# Patient Record
Sex: Female | Born: 1945 | Race: White | Hispanic: No | Marital: Married | Smoking: Former smoker
Health system: Southern US, Community
[De-identification: ages and names within clinical notes are randomized; demographics above are authoritative.]

## PROBLEM LIST (undated history)

## (undated) DIAGNOSIS — K648 Other hemorrhoids: Secondary | ICD-10-CM

## (undated) DIAGNOSIS — K219 Gastro-esophageal reflux disease without esophagitis: Secondary | ICD-10-CM

## (undated) DIAGNOSIS — C3490 Malignant neoplasm of unspecified part of unspecified bronchus or lung: Secondary | ICD-10-CM

## (undated) DIAGNOSIS — K635 Polyp of colon: Secondary | ICD-10-CM

## (undated) DIAGNOSIS — K5792 Diverticulitis of intestine, part unspecified, without perforation or abscess without bleeding: Secondary | ICD-10-CM

## (undated) HISTORY — DX: Polyp of colon: K63.5

## (undated) HISTORY — DX: Diverticulitis of intestine, part unspecified, without perforation or abscess without bleeding: K57.92

## (undated) HISTORY — PX: LOBECTOMY: SHX5089

## (undated) HISTORY — DX: Other hemorrhoids: K64.8

## (undated) HISTORY — PX: KNEE ARTHROSCOPY: SUR90

## (undated) HISTORY — DX: Gastro-esophageal reflux disease without esophagitis: K21.9

## (undated) HISTORY — PX: TONSILLECTOMY: SHX5217

## (undated) HISTORY — DX: Malignant neoplasm of unspecified part of unspecified bronchus or lung: C34.90

## (undated) HISTORY — PX: TUBAL LIGATION: SHX77

---

## 2002-02-19 LAB — HM COLONOSCOPY: HM Colonoscopy: NORMAL

## 2002-03-26 ENCOUNTER — Encounter: Payer: Self-pay | Admitting: Gastroenterology

## 2002-08-14 ENCOUNTER — Encounter: Payer: Self-pay | Admitting: Gastroenterology

## 2002-12-31 ENCOUNTER — Encounter: Payer: Self-pay | Admitting: Gastroenterology

## 2003-01-19 ENCOUNTER — Encounter: Payer: Self-pay | Admitting: Gastroenterology

## 2006-08-21 ENCOUNTER — Ambulatory Visit: Payer: Self-pay | Admitting: Orthopaedic Surgery

## 2006-09-12 ENCOUNTER — Ambulatory Visit: Payer: Self-pay | Admitting: Orthopaedic Surgery

## 2006-12-27 ENCOUNTER — Encounter: Payer: Self-pay | Admitting: Family Medicine

## 2006-12-30 ENCOUNTER — Ambulatory Visit: Payer: Self-pay | Admitting: Family Medicine

## 2007-01-24 ENCOUNTER — Ambulatory Visit: Payer: Self-pay | Admitting: Internal Medicine

## 2007-02-25 ENCOUNTER — Ambulatory Visit: Payer: Self-pay | Admitting: Internal Medicine

## 2007-02-26 ENCOUNTER — Telehealth: Payer: Self-pay | Admitting: Internal Medicine

## 2007-02-28 ENCOUNTER — Ambulatory Visit: Payer: Self-pay | Admitting: Internal Medicine

## 2007-02-28 LAB — CONVERTED CEMR LAB
BUN: 14 mg/dL (ref 6–23)
CO2: 28 meq/L (ref 19–32)
Phosphorus: 3.7 mg/dL (ref 2.3–4.6)
Potassium: 4.7 meq/L (ref 3.5–5.1)

## 2007-03-05 ENCOUNTER — Ambulatory Visit: Payer: Self-pay | Admitting: Internal Medicine

## 2007-03-12 ENCOUNTER — Ambulatory Visit: Payer: Self-pay | Admitting: Internal Medicine

## 2007-03-17 ENCOUNTER — Ambulatory Visit (HOSPITAL_COMMUNITY): Admission: RE | Admit: 2007-03-17 | Discharge: 2007-03-17 | Payer: Self-pay | Admitting: Internal Medicine

## 2007-03-25 ENCOUNTER — Ambulatory Visit: Payer: Self-pay | Admitting: Thoracic Surgery

## 2007-03-26 ENCOUNTER — Ambulatory Visit (HOSPITAL_COMMUNITY): Admission: RE | Admit: 2007-03-26 | Discharge: 2007-03-26 | Payer: Self-pay | Admitting: Thoracic Surgery

## 2007-04-17 ENCOUNTER — Encounter: Payer: Self-pay | Admitting: Thoracic Surgery

## 2007-04-17 ENCOUNTER — Inpatient Hospital Stay (HOSPITAL_COMMUNITY): Admission: RE | Admit: 2007-04-17 | Discharge: 2007-04-23 | Payer: Self-pay | Admitting: Thoracic Surgery

## 2007-04-21 ENCOUNTER — Ambulatory Visit: Payer: Self-pay | Admitting: Thoracic Surgery

## 2007-04-30 ENCOUNTER — Ambulatory Visit: Payer: Self-pay | Admitting: Thoracic Surgery

## 2007-04-30 ENCOUNTER — Ambulatory Visit: Payer: Self-pay | Admitting: Internal Medicine

## 2007-04-30 ENCOUNTER — Encounter: Admission: RE | Admit: 2007-04-30 | Discharge: 2007-04-30 | Payer: Self-pay | Admitting: Thoracic Surgery

## 2007-05-08 ENCOUNTER — Encounter: Payer: Self-pay | Admitting: Internal Medicine

## 2007-05-08 LAB — COMPREHENSIVE METABOLIC PANEL
ALT: 13 U/L (ref 0–35)
AST: 11 U/L (ref 0–37)
Albumin: 4 g/dL (ref 3.5–5.2)
Calcium: 9.5 mg/dL (ref 8.4–10.5)
Chloride: 104 mEq/L (ref 96–112)
Potassium: 5.4 mEq/L — ABNORMAL HIGH (ref 3.5–5.3)
Total Protein: 6.9 g/dL (ref 6.0–8.3)

## 2007-05-08 LAB — CBC WITH DIFFERENTIAL/PLATELET
BASO%: 0.1 % (ref 0.0–2.0)
EOS%: 8.6 % — ABNORMAL HIGH (ref 0.0–7.0)
HGB: 14.1 g/dL (ref 11.6–15.9)
MCH: 29 pg (ref 26.0–34.0)
MCHC: 34.6 g/dL (ref 32.0–36.0)
MONO#: 0.5 10*3/uL (ref 0.1–0.9)
RDW: 12.6 % (ref 11.3–14.5)
WBC: 9.9 10*3/uL (ref 3.9–10.0)
lymph#: 1.5 10*3/uL (ref 0.9–3.3)

## 2007-05-21 ENCOUNTER — Encounter: Admission: RE | Admit: 2007-05-21 | Discharge: 2007-05-21 | Payer: Self-pay | Admitting: Thoracic Surgery

## 2007-05-21 ENCOUNTER — Ambulatory Visit: Payer: Self-pay | Admitting: Thoracic Surgery

## 2007-07-01 ENCOUNTER — Ambulatory Visit: Payer: Self-pay | Admitting: Thoracic Surgery

## 2007-07-01 ENCOUNTER — Encounter: Admission: RE | Admit: 2007-07-01 | Discharge: 2007-07-01 | Payer: Self-pay | Admitting: Thoracic Surgery

## 2007-07-17 ENCOUNTER — Telehealth: Payer: Self-pay | Admitting: Internal Medicine

## 2007-07-25 ENCOUNTER — Encounter: Payer: Self-pay | Admitting: Internal Medicine

## 2007-09-18 ENCOUNTER — Ambulatory Visit: Payer: Self-pay | Admitting: Internal Medicine

## 2007-09-18 DIAGNOSIS — C3491 Malignant neoplasm of unspecified part of right bronchus or lung: Secondary | ICD-10-CM

## 2007-09-22 LAB — CONVERTED CEMR LAB
Albumin: 4 g/dL (ref 3.5–5.2)
BUN: 13 mg/dL (ref 6–23)
Basophils Relative: 0.8 % (ref 0.0–3.0)
Calcium: 9.3 mg/dL (ref 8.4–10.5)
Cholesterol: 223 mg/dL (ref 0–200)
Eosinophils Relative: 3.1 % (ref 0.0–5.0)
Glucose, Bld: 84 mg/dL (ref 70–99)
HCT: 43.5 % (ref 36.0–46.0)
Hemoglobin: 14.9 g/dL (ref 12.0–15.0)
Monocytes Absolute: 0.4 10*3/uL (ref 0.1–1.0)
Monocytes Relative: 4.5 % (ref 3.0–12.0)
Neutro Abs: 6.1 10*3/uL (ref 1.4–7.7)
RBC: 5.16 M/uL — ABNORMAL HIGH (ref 3.87–5.11)
Total CHOL/HDL Ratio: 6.3
WBC: 8.6 10*3/uL (ref 4.5–10.5)

## 2007-10-24 ENCOUNTER — Ambulatory Visit: Payer: Self-pay | Admitting: Family Medicine

## 2007-10-28 ENCOUNTER — Ambulatory Visit: Payer: Self-pay | Admitting: Internal Medicine

## 2007-10-30 ENCOUNTER — Ambulatory Visit (HOSPITAL_COMMUNITY): Admission: RE | Admit: 2007-10-30 | Discharge: 2007-10-30 | Payer: Self-pay | Admitting: Internal Medicine

## 2007-10-30 LAB — CBC WITH DIFFERENTIAL/PLATELET
BASO%: 0.2 % (ref 0.0–2.0)
EOS%: 3.8 % (ref 0.0–7.0)
HCT: 42.8 % (ref 34.8–46.6)
MCH: 28.5 pg (ref 26.0–34.0)
MCHC: 34.1 g/dL (ref 32.0–36.0)
NEUT%: 66.6 % (ref 39.6–76.8)
RBC: 5.13 10*6/uL (ref 3.70–5.32)
WBC: 6.4 10*3/uL (ref 3.9–10.0)
lymph#: 1.5 10*3/uL (ref 0.9–3.3)

## 2007-10-30 LAB — COMPREHENSIVE METABOLIC PANEL
ALT: 18 U/L (ref 0–35)
AST: 17 U/L (ref 0–37)
Albumin: 3.9 g/dL (ref 3.5–5.2)
CO2: 29 mEq/L (ref 19–32)
Calcium: 9.2 mg/dL (ref 8.4–10.5)
Chloride: 105 mEq/L (ref 96–112)
Creatinine, Ser: 0.97 mg/dL (ref 0.40–1.20)
Potassium: 4.6 mEq/L (ref 3.5–5.3)
Sodium: 140 mEq/L (ref 135–145)
Total Protein: 6.9 g/dL (ref 6.0–8.3)

## 2007-11-06 ENCOUNTER — Encounter: Payer: Self-pay | Admitting: Internal Medicine

## 2007-11-11 ENCOUNTER — Telehealth (INDEPENDENT_AMBULATORY_CARE_PROVIDER_SITE_OTHER): Payer: Self-pay | Admitting: *Deleted

## 2007-11-11 ENCOUNTER — Ambulatory Visit: Payer: Self-pay | Admitting: Thoracic Surgery

## 2007-11-19 ENCOUNTER — Ambulatory Visit: Payer: Self-pay | Admitting: Internal Medicine

## 2008-01-01 ENCOUNTER — Encounter: Payer: Self-pay | Admitting: Internal Medicine

## 2008-02-02 ENCOUNTER — Ambulatory Visit: Payer: Self-pay | Admitting: Obstetrics and Gynecology

## 2008-02-04 ENCOUNTER — Ambulatory Visit: Payer: Self-pay | Admitting: Obstetrics and Gynecology

## 2008-03-09 ENCOUNTER — Telehealth: Payer: Self-pay | Admitting: Internal Medicine

## 2008-03-12 ENCOUNTER — Ambulatory Visit: Payer: Self-pay | Admitting: Gastroenterology

## 2008-04-27 ENCOUNTER — Ambulatory Visit: Payer: Self-pay | Admitting: Internal Medicine

## 2008-04-29 ENCOUNTER — Ambulatory Visit (HOSPITAL_COMMUNITY): Admission: RE | Admit: 2008-04-29 | Discharge: 2008-04-29 | Payer: Self-pay | Admitting: Internal Medicine

## 2008-04-29 LAB — COMPREHENSIVE METABOLIC PANEL
ALT: 21 U/L (ref 0–35)
Albumin: 4 g/dL (ref 3.5–5.2)
Alkaline Phosphatase: 67 U/L (ref 39–117)
CO2: 27 mEq/L (ref 19–32)
Potassium: 4.4 mEq/L (ref 3.5–5.3)
Sodium: 139 mEq/L (ref 135–145)
Total Bilirubin: 0.6 mg/dL (ref 0.3–1.2)
Total Protein: 7 g/dL (ref 6.0–8.3)

## 2008-04-29 LAB — CBC WITH DIFFERENTIAL/PLATELET
BASO%: 0.2 % (ref 0.0–2.0)
Eosinophils Absolute: 0.1 10*3/uL (ref 0.0–0.5)
LYMPH%: 14.5 % (ref 14.0–49.7)
MCHC: 34.4 g/dL (ref 31.5–36.0)
MONO#: 0.5 10*3/uL (ref 0.1–0.9)
NEUT#: 6.3 10*3/uL (ref 1.5–6.5)
Platelets: 285 10*3/uL (ref 145–400)
RBC: 5.27 10*6/uL (ref 3.70–5.45)
RDW: 12.7 % (ref 11.2–14.5)
WBC: 8.1 10*3/uL (ref 3.9–10.3)

## 2008-04-30 ENCOUNTER — Ambulatory Visit: Payer: Self-pay | Admitting: Family Medicine

## 2008-05-05 ENCOUNTER — Encounter: Payer: Self-pay | Admitting: Internal Medicine

## 2008-05-19 ENCOUNTER — Ambulatory Visit: Payer: Self-pay | Admitting: Thoracic Surgery

## 2008-05-20 ENCOUNTER — Ambulatory Visit: Payer: Self-pay | Admitting: Family Medicine

## 2008-05-26 ENCOUNTER — Ambulatory Visit: Payer: Self-pay | Admitting: Gastroenterology

## 2008-05-26 DIAGNOSIS — K648 Other hemorrhoids: Secondary | ICD-10-CM | POA: Insufficient documentation

## 2008-05-26 DIAGNOSIS — Z860100 Personal history of colon polyps, unspecified: Secondary | ICD-10-CM | POA: Insufficient documentation

## 2008-05-26 DIAGNOSIS — K219 Gastro-esophageal reflux disease without esophagitis: Secondary | ICD-10-CM

## 2008-05-26 DIAGNOSIS — K5730 Diverticulosis of large intestine without perforation or abscess without bleeding: Secondary | ICD-10-CM | POA: Insufficient documentation

## 2008-06-17 ENCOUNTER — Encounter (INDEPENDENT_AMBULATORY_CARE_PROVIDER_SITE_OTHER): Payer: Self-pay

## 2008-07-20 ENCOUNTER — Ambulatory Visit: Payer: Self-pay | Admitting: Gastroenterology

## 2008-07-21 LAB — CONVERTED CEMR LAB
Fecal Occult Blood: NEGATIVE
OCCULT 2: NEGATIVE
OCCULT 3: NEGATIVE
OCCULT 4: NEGATIVE

## 2008-10-28 ENCOUNTER — Ambulatory Visit: Payer: Self-pay | Admitting: Internal Medicine

## 2008-11-01 ENCOUNTER — Ambulatory Visit (HOSPITAL_COMMUNITY): Admission: RE | Admit: 2008-11-01 | Discharge: 2008-11-01 | Payer: Self-pay | Admitting: Internal Medicine

## 2008-11-01 LAB — COMPREHENSIVE METABOLIC PANEL
ALT: 24 U/L (ref 0–35)
Albumin: 3.8 g/dL (ref 3.5–5.2)
CO2: 29 mEq/L (ref 19–32)
Calcium: 9.2 mg/dL (ref 8.4–10.5)
Chloride: 104 mEq/L (ref 96–112)
Glucose, Bld: 94 mg/dL (ref 70–99)
Potassium: 5 mEq/L (ref 3.5–5.3)
Sodium: 138 mEq/L (ref 135–145)
Total Bilirubin: 0.7 mg/dL (ref 0.3–1.2)
Total Protein: 7 g/dL (ref 6.0–8.3)

## 2008-11-01 LAB — CBC WITH DIFFERENTIAL/PLATELET
BASO%: 0.4 % (ref 0.0–2.0)
Eosinophils Absolute: 0.2 10*3/uL (ref 0.0–0.5)
LYMPH%: 26.6 % (ref 14.0–49.7)
MCHC: 34.9 g/dL (ref 31.5–36.0)
MONO#: 0.3 10*3/uL (ref 0.1–0.9)
NEUT#: 3 10*3/uL (ref 1.5–6.5)
Platelets: 257 10*3/uL (ref 145–400)
RBC: 5.2 10*6/uL (ref 3.70–5.45)
RDW: 12.1 % (ref 11.2–14.5)
WBC: 4.8 10*3/uL (ref 3.9–10.3)
lymph#: 1.3 10*3/uL (ref 0.9–3.3)

## 2008-11-08 ENCOUNTER — Encounter: Payer: Self-pay | Admitting: Internal Medicine

## 2008-11-16 ENCOUNTER — Ambulatory Visit: Payer: Self-pay | Admitting: Thoracic Surgery

## 2008-12-13 ENCOUNTER — Telehealth: Payer: Self-pay | Admitting: Internal Medicine

## 2008-12-14 ENCOUNTER — Ambulatory Visit: Payer: Self-pay | Admitting: Internal Medicine

## 2009-01-24 ENCOUNTER — Ambulatory Visit: Payer: Self-pay | Admitting: Family Medicine

## 2009-05-03 ENCOUNTER — Ambulatory Visit: Payer: Self-pay | Admitting: Internal Medicine

## 2009-05-05 ENCOUNTER — Ambulatory Visit (HOSPITAL_COMMUNITY): Admission: RE | Admit: 2009-05-05 | Discharge: 2009-05-05 | Payer: Self-pay | Admitting: Internal Medicine

## 2009-05-05 LAB — COMPREHENSIVE METABOLIC PANEL
CO2: 29 mEq/L (ref 19–32)
Creatinine, Ser: 0.86 mg/dL (ref 0.40–1.20)
Glucose, Bld: 98 mg/dL (ref 70–99)
Total Bilirubin: 0.7 mg/dL (ref 0.3–1.2)
Total Protein: 6.7 g/dL (ref 6.0–8.3)

## 2009-05-05 LAB — CBC WITH DIFFERENTIAL/PLATELET
Basophils Absolute: 0 10*3/uL (ref 0.0–0.1)
Eosinophils Absolute: 0.2 10*3/uL (ref 0.0–0.5)
HCT: 43.8 % (ref 34.8–46.6)
LYMPH%: 29.3 % (ref 14.0–49.7)
MONO#: 0.4 10*3/uL (ref 0.1–0.9)
NEUT#: 3.1 10*3/uL (ref 1.5–6.5)
NEUT%: 59.9 % (ref 38.4–76.8)
Platelets: 271 10*3/uL (ref 145–400)
WBC: 5.2 10*3/uL (ref 3.9–10.3)

## 2009-05-09 ENCOUNTER — Encounter: Payer: Self-pay | Admitting: Internal Medicine

## 2009-05-27 ENCOUNTER — Ambulatory Visit: Payer: Self-pay | Admitting: Internal Medicine

## 2009-05-27 DIAGNOSIS — G479 Sleep disorder, unspecified: Secondary | ICD-10-CM | POA: Insufficient documentation

## 2009-06-08 ENCOUNTER — Ambulatory Visit: Payer: Self-pay | Admitting: Internal Medicine

## 2009-06-08 DIAGNOSIS — M549 Dorsalgia, unspecified: Secondary | ICD-10-CM | POA: Insufficient documentation

## 2009-06-09 LAB — CONVERTED CEMR LAB
AST: 17 units/L (ref 0–37)
BUN: 11 mg/dL (ref 6–23)
Basophils Absolute: 0 10*3/uL (ref 0.0–0.1)
Calcium: 8.9 mg/dL (ref 8.4–10.5)
Cholesterol: 213 mg/dL — ABNORMAL HIGH (ref 0–200)
Creatinine, Ser: 0.9 mg/dL (ref 0.4–1.2)
Direct LDL: 149.1 mg/dL
Eosinophils Relative: 2.8 % (ref 0.0–5.0)
GFR calc non Af Amer: 66.76 mL/min (ref >60)
Glucose, Bld: 87 mg/dL (ref 70–99)
HDL: 40.9 mg/dL (ref >39.00)
Lymphs Abs: 1.4 10*3/uL (ref 0.7–4.0)
MCV: 84.4 fL (ref 78.0–100.0)
Monocytes Absolute: 0.3 10*3/uL (ref 0.1–1.0)
Neutrophils Relative %: 63.2 % (ref 43.0–77.0)
Platelets: 270 10*3/uL (ref 150.0–400.0)
RDW: 12.6 % (ref 11.5–14.6)
Sodium: 141 meq/L (ref 135–145)
TSH: 4.41 microintl units/mL (ref 0.35–5.50)
Total Bilirubin: 0.7 mg/dL (ref 0.3–1.2)
Total CHOL/HDL Ratio: 5
Triglycerides: 247 mg/dL — ABNORMAL HIGH (ref 0.0–149.0)
VLDL: 49.4 mg/dL — ABNORMAL HIGH (ref 0.0–40.0)
WBC: 5.3 10*3/uL (ref 4.5–10.5)

## 2009-06-29 ENCOUNTER — Ambulatory Visit: Payer: Self-pay | Admitting: Obstetrics and Gynecology

## 2009-08-31 ENCOUNTER — Telehealth: Payer: Self-pay | Admitting: Family Medicine

## 2009-10-02 IMAGING — PT NM PET TUM IMG SKULL BASE T - THIGH
1 of 6 series · 1 of 25 positions shown · non-contrast
Comparison: CT of the chest 03/05/07.

CLINICAL DATA: Lung mass. 
FDG PET-CT TUMOR IMAGING (SKULL BASE TO THIGHS):
Fasting Blood Glucose:  94
TECHNIQUE: 17.6 mCi F-18 FDG were administered via right hand.  Full ring PET imaging was performed from the skull base through the mid-thighs 85 minutes after injection.  CT data was obtained and used for attenuation correction and anatomic localization only.  (This was not acquired as a diagnostic CT examination.)

[Series 1: pet ac · axial · 3.3mm · 4.69mm/px · 1 of 267 slices shown]
[im 134/267]
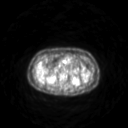

[1 of 25 positions shown; findings below may reference images not displayed]

FINDINGS: Posterior aspect of the right lower lobe 2 x 2.6 cm spiculated mass demonstrates FDG uptake with maximal SUV of 4.6.  The CT appearance is worrisome for malignancy and the FDG uptake although not marked, is within the malignant range and is therefore suspicious for malignancy.  No surrounding malignant range FDG uptake within surrounding lymph nodes.  There are areas of increased FDG uptake involving right lower ribs (maximal SUV 4.1) in regions where the patient is noted to have callus formation from prior fractures and unlikely to be related to metastatic disease.  The tiny nodule seen within the superior segment of the right lower lobe on the recent CT scan is too small to adequately assessed by PET CT imaging.  No malignant range FDG uptake is seen in this region. 
Limited CT scan which was obtained for attenuation correction reveals the following:  
Paranasal sinus mucosal thickening most notable ethmoid sinus air cells in the left maxillary sinus.  Prominent left L5-S1 facet joint degenerative changes and bony overgrowth.  Sigmoid diverticula without CT evidence of diverticulitis.  Small calcification right breast.  Correlation with mammography recommended.
IMPRESSION: 1.  Malignant range FDG uptake within 2 x 2.6 cm mass contained within the right lower lobe.  This may represent malignancy.  Infectious or inflammatory process would need to be a diagnosis of exclusion.  No surrounding FDG avid adenopathy.  
2.  Old right sided rib fractures with FDG uptake. 
3.  Small calcification right breast.  Correlation with mammography recommended. 
4.  Sigmoid diverticula without CT evidence of diverticulitis.

## 2009-11-04 ENCOUNTER — Ambulatory Visit: Payer: Self-pay | Admitting: Internal Medicine

## 2009-11-04 DIAGNOSIS — R220 Localized swelling, mass and lump, head: Secondary | ICD-10-CM | POA: Insufficient documentation

## 2009-11-04 DIAGNOSIS — R221 Localized swelling, mass and lump, neck: Secondary | ICD-10-CM

## 2009-11-07 ENCOUNTER — Ambulatory Visit (HOSPITAL_COMMUNITY): Admission: RE | Admit: 2009-11-07 | Discharge: 2009-11-07 | Payer: Self-pay | Admitting: Internal Medicine

## 2009-11-07 ENCOUNTER — Ambulatory Visit: Payer: Self-pay | Admitting: Internal Medicine

## 2009-11-07 LAB — CBC WITH DIFFERENTIAL/PLATELET
BASO%: 0.4 % (ref 0.0–2.0)
EOS%: 2.2 % (ref 0.0–7.0)
HGB: 15.1 g/dL (ref 11.6–15.9)
MCH: 29.5 pg (ref 25.1–34.0)
MCHC: 34.4 g/dL (ref 31.5–36.0)
MCV: 85.8 fL (ref 79.5–101.0)
MONO%: 6.3 % (ref 0.0–14.0)
RBC: 5.13 10*6/uL (ref 3.70–5.45)
RDW: 12.8 % (ref 11.2–14.5)
lymph#: 1.4 10*3/uL (ref 0.9–3.3)

## 2009-11-09 ENCOUNTER — Encounter: Payer: Self-pay | Admitting: Internal Medicine

## 2009-11-09 LAB — COMPREHENSIVE METABOLIC PANEL
CO2: 22 mEq/L (ref 19–32)
Creatinine, Ser: 0.84 mg/dL (ref 0.40–1.20)
Glucose, Bld: 90 mg/dL (ref 70–99)
Total Bilirubin: 0.6 mg/dL (ref 0.3–1.2)

## 2009-11-09 LAB — CBC WITH DIFFERENTIAL/PLATELET
Basophils Absolute: 0 10*3/uL (ref 0.0–0.1)
EOS%: 2.1 % (ref 0.0–7.0)
Eosinophils Absolute: 0.1 10*3/uL (ref 0.0–0.5)
HGB: 15.5 g/dL (ref 11.6–15.9)
MCV: 85.2 fL (ref 79.5–101.0)
MONO%: 6.5 % (ref 0.0–14.0)
NEUT#: 4.4 10*3/uL (ref 1.5–6.5)
RBC: 5.27 10*6/uL (ref 3.70–5.45)
RDW: 12.7 % (ref 11.2–14.5)
lymph#: 1.9 10*3/uL (ref 0.9–3.3)

## 2009-11-10 ENCOUNTER — Encounter: Payer: Self-pay | Admitting: Internal Medicine

## 2009-12-27 ENCOUNTER — Encounter: Payer: Self-pay | Admitting: Internal Medicine

## 2010-02-14 ENCOUNTER — Ambulatory Visit
Admission: RE | Admit: 2010-02-14 | Discharge: 2010-02-14 | Payer: Self-pay | Source: Home / Self Care | Attending: Internal Medicine | Admitting: Internal Medicine

## 2010-02-14 DIAGNOSIS — M76899 Other specified enthesopathies of unspecified lower limb, excluding foot: Secondary | ICD-10-CM

## 2010-03-11 ENCOUNTER — Other Ambulatory Visit: Payer: Self-pay | Admitting: Internal Medicine

## 2010-03-11 DIAGNOSIS — C3490 Malignant neoplasm of unspecified part of unspecified bronchus or lung: Secondary | ICD-10-CM

## 2010-03-21 NOTE — Letter (Signed)
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

## 2010-03-21 NOTE — Letter (Signed)
[facility]   Imported By: Lanelle Bal 05/24/2009 12:01:57  _____________________________________________________________________  External Attachment:    Type:   Image     Comment:   External Document  Appended Document: [facility] no evidence of lung cancer recurrence 6 month follow up

## 2010-03-21 NOTE — Assessment & Plan Note (Signed)
Summary: F/U/CLE   Vital Signs:  Patient profile:   65 year old female Weight:      161 pounds Temp:     98.1 degrees F oral Pulse rate:   68 / minute Pulse rhythm:   regular BP sitting:   128 / 78  (left arm) Cuff size:   regular  Vitals Entered By: Mervin Hack CMA Duncan Dull) (May 27, 2009 9:16 AM) CC: follow-up visit   History of Present Illness: Doing well recent oncology evaluation was fine She doesn't get nervous  stomach has been okay only uses nexium as needed and this is rarely bowels fine---at least once a day  Just saw Dr Toya Smothers scheduled for mammo  Chronic sleep problems--up often and sleeps in 2 hour intervals No sig daytime somnolence Restless legs in the past--didn't tolerate the med not an issue now no major snoring-not a major issue unless lies on back  Allergies: 1)  ! * Pain Meds  Past History:  Past medical, surgical, family and social histories (including risk factors) reviewed for relevance to current acute and chronic problems.  Past Medical History: Reviewed history from 05/26/2008 and no changes required. Lung cancer--mixed acinar and non-mucinous bronchoalveolar, Stage 1A GERD/EE Diverticulosis Internal hemorrhoisds Hyperplastic colon polyp 12/2002  Past Surgical History: Reviewed history from 09/18/2007 and no changes required. Arthroscopy L knee--09/12/06--[facility] Tonsillectomy--`1953 Tubal ligation May 1977 Left lower lobectomy 1/09   [facility]  Family History: Reviewed history from 05/26/2008 and no changes required. Father: died at 106 of bile duct cancer Mother: died at 69 of old age, heart problem Siblings: 1 half--MI at 100,  DM in mat uncle Family History of Breast Cancer: Aunt No FH of Colon Cancer:  Social History: Reviewed history from 05/26/2008 and no changes required. Marital Status: Married Children:  2--oldest son killed by drunk driver,  Melanee Spry, lives in [place]--new first grand daughter. 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  sister-in-law lives her Patient states former smoker. Quit 1985 Alcohol Use - yes-1-2 drinks per week Illicit Drug Use - no Patient gets regular exercise.  Review of Systems  The patient denies chest pain, syncope, and dyspnea on exertion.         Regular exercise--[facility] [employer] --3 days per week and then some other things in between Occ gets right upper torso pain from adhesions if she twists a certain way Occ catch in left knee--past arthroscopy no major arthritic pain though  Physical Exam  General:  alert and normal appearance.   Neck:  supple, no masses, no thyromegaly, no carotid bruits, and no cervical lymphadenopathy.   Lungs:  normal respiratory effort and normal breath sounds.   Heart:  normal rate, regular rhythm, no murmur, and no gallop.   Abdomen:  soft and non-tender.   Msk:  no joint tenderness and no joint swelling.   Extremities:  no edema Psych:  normally interactive, good eye contact, not anxious appearing, and not depressed appearing.     Impression & Recommendations:  Problem # 1:  SLEEP DISORDER (ICD-780.50) Assessment New  has had ongoing issues but bothers her some now will check labs (with history of restless legs in past) try melatonin if needs something else, will try trazodone  Orders: TLB-Renal Function Panel (80069-RENAL) TLB-CBC Platelet - w/Differential (85025-CBCD) TLB-TSH (Thyroid Stimulating Hormone) (84443-TSH) TLB-Hepatic/Liver Function Pnl (80076-HEPATIC) Venipuncture (40981)  Problem # 2:  GERD (ICD-530.81) Assessment: Improved only uses med as needed   Her updated medication list for this problem  includes:    Nexium 40 Mg Cpdr (Esomeprazole magnesium) .Marland Kitchen... Take  one about 30 minutes before breakfast  Complete Medication List: 1)  Nexium 40 Mg Cpdr (Esomeprazole magnesium) .... Take  one about 30 minutes before breakfast 2)  Centrum Silver Tabs (Multiple vitamins-minerals) .... Take 1 tablet  by mouth once a day 3)  Calcium-carb 600 + D 600-125 Mg-unit Tabs (Calcium-vitamin d) .... Once daily 4)  Fish Oil Oil (Fish oil) .... Once daily  Other Orders: TLB-Lipid Panel (80061-LIPID) Pneumococcal Vaccine ([address]) Admin 1st Vaccine (60454)  Patient Instructions: 1)  Okay to try melatonin (over the counter) for the sleep problems 2)  Please schedule a follow-up appointment in 1-2 years  Current Allergies (reviewed today): ! * PAIN MEDS   Immunizations Administered:  Pneumonia Vaccine:    Vaccine Type: Pneumovax    Site: right deltoid    Mfr: Merck    Dose: 0.5 ml    Route: IM    Given by: Mervin Hack CMA (AAMA)    Exp. Date: 10/03/2010    Lot #: [id number]    VIS given: 09/17/95 version given May 27, 2009.

## 2010-03-21 NOTE — Consult Note (Signed)
Summary: [facility]  [facility]   Imported By: Maryln Gottron 11/18/2009 13:49:02  _____________________________________________________________________  External Attachment:    Type:   Image     Comment:   External Document  Appended Document: [facility] biopsy of nasal lesion various keratoses

## 2010-03-21 NOTE — Assessment & Plan Note (Signed)
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 * Pain Meds  Past History:  Past medical, surgical, family and social histories (including risk factors) reviewed for relevance to current acute and chronic problems.  Past Medical History: Reviewed history from 05/26/2008 and no changes required. Lung cancer--mixed acinar and non-mucinous bronchoalveolar, Stage 1A GERD/EE Diverticulosis Internal hemorrhoisds Hyperplastic colon polyp 12/2002  Past Surgical History: Reviewed history from 09/18/2007 and no changes required. Arthroscopy L knee--09/12/06--[facility] Tonsillectomy--`1953 Tubal ligation May 1977 Left lower lobectomy 1/09   [facility]  Family History: Reviewed history from 05/26/2008 and no changes required. Father: died at 61 of bile duct cancer Mother: died at 53 of old age, heart problem Siblings: 1 half--MI at 55,  DM in mat uncle Family  History of Breast Cancer: Aunt No FH of Colon Cancer:  Social History: Reviewed history from 05/26/2008 and no changes required. Marital Status: Married Children:  2--oldest son killed by drunk driver,  Melanee Spry, lives in [place]--new first grand daughter. 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  sister-in-law lives her Patient states former smoker. Quit 1985 Alcohol Use - yes-1-2 drinks per week Illicit Drug Use - no Patient gets regular exercise.  Review of Systems       no change in bowel or bladder habits no leg weakness  Physical Exam  General:  alert.  NAD Msk:  no sig spine tenderness tender along right lumbar paraspinals Flexion to 85 degrees SLR negative bilat Gait antalgic but loosens up quickly Neurologic:  strength and reflexes are normal in legs   Impression & Recommendations:  Problem # 1:  BACK PAIN (ICD-724.5) Assessment New  actually recurrence of long standing problem discussed benign muscle strain try heat and ibuprofen muscle relaxer for bedtime  Her updated medication list for this problem includes:    Cyclobenzaprine Hcl 10 Mg Tabs (Cyclobenzaprine hcl) .Marland Kitchen... 1/2-1 tab at bedtime as needed for muscle spasm  Orders: Prescription Created Electronically [phone])  Complete Medication List: 1)  Nexium 40 Mg Cpdr (Esomeprazole magnesium) .... Take  one about 30 minutes before breakfast 2)  Centrum Silver Tabs (Multiple vitamins-minerals) .... Take 1 tablet by mouth once a day 3)  Calcium-carb 600 + D 600-125 Mg-unit Tabs (  Calcium-vitamin d) .... Once daily 4)  Fish Oil Oil (Fish oil) .... Once daily 5)  Cyclobenzaprine Hcl 10 Mg Tabs (Cyclobenzaprine hcl) .... 1/2-1 tab at bedtime as needed for muscle spasm  Patient Instructions: 1)  Please try ibuprofen 200mg , 2-3 tabs three times a day after meals till back is better 2)  Please schedule a follow-up appointment as needed .  Prescriptions: CYCLOBENZAPRINE HCL 10 MG TABS (CYCLOBENZAPRINE HCL)  1/2-1 tab at bedtime as needed for muscle spasm  #30 x 0   Entered and Authorized by:   Cindee Salt MD   Signed by:   Cindee Salt MD on 06/08/2009   Method used:   [facility] to        [facility] (retail)       [address]       [place], [place]  [address]       Ph: [phone] or [phone]       Fax: [phone]   RxID:   [id number]   Current Allergies (reviewed today): ! * PAIN MEDS

## 2010-03-21 NOTE — Progress Notes (Signed)
Summary: wants something for sleep  Phone Note Call from Patient Call back at Home Phone [phone]   Caller: Patient Summary of Call: Pt is requesting something to help her sleep.  She said Dr. Alphonsus Sias had told her she could always get something if she needed it.  Note from 05/27/09 does mention giving her trazadone.  [facility]  Please let her know. Initial call taken by: Lowella Petties CMA,  August 31, 2009 3:02 PM  Follow-up for Phone Call        trazadone 100 mg, 1 by mouth at bedtime as needed insomnia, #30, 0 refills Follow-up by: Hannah Beat MD,  August 31, 2009 3:10 PM    New/Updated Medications: TRAZODONE HCL 100 MG TABS (TRAZODONE HCL) take on tablet at bedtime Prescriptions: TRAZODONE HCL 100 MG TABS (TRAZODONE HCL) take on tablet at bedtime  #30 x 0   Entered by:   Benny Lennert CMA (AAMA)   Authorized by:   Hannah Beat MD   Signed by:   Benny Lennert CMA (AAMA) on 09/01/2009   Method used:   [facility]        [facility] [id number]       [address]       [place], [place]  [address]       Ph: [phone] or [phone]       Fax: [phone]   RxID:   [id number]   Prior Medications: NEXIUM 40 MG  CPDR (ESOMEPRAZOLE MAGNESIUM) Take  one about 30 minutes before breakfast CENTRUM SILVER   TABS (MULTIPLE VITAMINS-MINERALS) Take 1 tablet by mouth once a day CALCIUM-CARB 600 + D 600-125 MG-UNIT  TABS (CALCIUM-VITAMIN D) once daily FISH OIL   OIL (FISH OIL) once daily CYCLOBENZAPRINE HCL 10 MG TABS (CYCLOBENZAPRINE HCL) 1/2-1 tab at bedtime as needed for muscle spasm Current Allergies: ! * PAIN MEDS

## 2010-03-21 NOTE — Assessment & Plan Note (Signed)
Summary: NECK SWELLING/JRR   Vital Signs:  Patient profile:   65 year old female Weight:      161.75 pounds Temp:     97.8 degrees F oral Pulse rate:   84 / minute Pulse rhythm:   regular BP sitting:   116 / 78  (left arm) Cuff size:   regular  Vitals Entered By: Selena Batten Dance CMA Duncan Dull) (November 04, 2009 8:11 AM) CC: Neck swelling yesterday   History of Present Illness: CC: neck swelling?  Yesterday eating Qdoba's for lunch and noticed neck swelling right side lower jaw.  Also noted whitehead under tongue.  Swelling slowly went away.  Didn't use anything to expedite.  No SOB, did hurt swallowing.  No swelling of lips or tongue.  No other rash.  tender to touch.  No fevers/cills, nausea, vomiting, abd pain, diarrhea.  No new joint pains.  No drooling, trouble opening mouth, hoarseness.  Came in yesterday but we weren't able to fit into schedule.  Had chicken veggie burrito.  No new meds, foods recently that patient can think of  h/o adenocarcinoma lung, s/p resection, scheduled for rpt CT scan on Monday and then will see Dr. Jerolyn Center.  trouble sleeping - tried melatonin, didn't help.  Started on trazadone 100mg  at bedtime.  Didn't help.  In past prescrition for Remus Loffler did work.  Would like this again.  trouble maintaining sleep.  Current Medications (verified): 1)  Nexium 40 Mg  Cpdr (Esomeprazole Magnesium) .... Take  One About 30 Minutes Before Breakfast 2)  Centrum Silver   Tabs (Multiple Vitamins-Minerals) .... Take 1 Tablet By Mouth Once A Day 3)  Calcium-Carb 600 + D 600-125 Mg-Unit  Tabs (Calcium-Vitamin D) .... Once Daily 4)  Fish Oil   Oil (Fish Oil) .... Once Daily 5)  Cyclobenzaprine Hcl 10 Mg Tabs (Cyclobenzaprine Hcl) .... 1/2-1 Tab At Bedtime As Needed For Muscle Spasm 6)  Trazodone Hcl 100 Mg Tabs (Trazodone Hcl) .... Take On Tablet At Bedtime  Allergies: 1)  * Pain Meds  Past History:  Past Medical History: Last updated: 05/26/2008 Lung cancer--mixed acinar  and non-mucinous bronchoalveolar, Stage 1A GERD/EE Diverticulosis Internal hemorrhoisds Hyperplastic colon polyp 12/2002  Social History: Last updated: 05/26/2008 Marital Status: Married Children:  2--oldest son killed by drunk driver,  Melanee Spry, lives in [place] first grand daughter. 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  sister-in-law lives her Patient states former smoker. Quit 1985 Alcohol Use - yes-1-2 drinks per week Illicit Drug Use - no Patient gets regular exercise.  Review of Systems       per HPI  Physical Exam  General:  alert.  NAD Head:  Normocephalic and atraumatic without obvious abnormalities. No apparent alopecia or balding. Mouth:  Oral mucosa and oropharynx without lesions or exudates.  Teeth in good repair w/ fillings.  Nontender teeth.  + residual tiny crater where whitehead was yesterday Neck:  supple, no masses, no thyromegaly, no carotid bruits, and no cervical lymphadenopathy.  FROM Lungs:  normal respiratory effort and normal breath sounds.   Heart:  normal rate, regular rhythm, no murmur, and no gallop.   Skin:  Intact without suspicious lesions or rashes   Impression & Recommendations:  Problem # 1:  SWELLING, NECK (ICD-784.2) ? allergic to qdoba burrito vs soft tissue swelling from whitehead on tongue.  However today resolved, no evidence of asymmetry on exam.  red flags discussed to return, seek urgent medical care.  o/w reassured.  Problem # 2:  NEOPLASM,  MALIGNANT, LUNG (ICD-162.9) scheduled rpt CT scan Monday.  Problem # 3:  SLEEP DISORDER (ICD-780.50) melatonin, trazodone did not help.  trial of ambien.  consider trial of benadryl or unisom.  Complete Medication List: 1)  Nexium 40 Mg Cpdr (Esomeprazole magnesium) .... Take  one about 30 minutes before breakfast 2)  Centrum Silver Tabs (Multiple vitamins-minerals) .... Take 1 tablet by mouth once a day 3)  Calcium-carb 600 + D 600-125 Mg-unit Tabs (Calcium-vitamin d) .... Once  daily 4)  Fish Oil Oil (Fish oil) .... Once daily 5)  Cyclobenzaprine Hcl 10 Mg Tabs (Cyclobenzaprine hcl) .... 1/2-1 tab at bedtime as needed for muscle spasm 6)  Zolpidem Tartrate 10 Mg Tabs (Zolpidem tartrate) .... One by mouth at bedtime as needed insomnia  Patient Instructions: 1)  This could have been allergic reaction to something new in food or from the pimple in tongue. 2)  I'm glad it's gone. 3)  If you start having fevers, drooling, hoarse voice or trouble opening mouth or swallowing, you may need to be seen again.  if you have lip/tongue/mouth swelling or trouble breathing, that is an emergency and you need to call 911. Prescriptions: ZOLPIDEM TARTRATE 10 MG TABS (ZOLPIDEM TARTRATE) one by mouth at bedtime as needed insomnia  #30 x 0   Entered and Authorized by:   Eustaquio Boyden  MD   Signed by:   Eustaquio Boyden  MD on 11/04/2009   Method used:   Print then Give to Patient   RxID:   [id number]   Current Allergies (reviewed today): * PAIN MEDS  Appended Document: NECK SWELLING/JRR    Clinical Lists Changes  Orders: Added new Service order of Flu Vaccine 16yrs + [phone]) - Signed Added new Service order of Admin 1st Vaccine (03474) - Signed Observations: Added new observation of PNEUVAXDECLN: Not indicated (11/04/2009 9:41) Added new observation of TDBOOSTDUE: 12/15/2018 (11/04/2009 9:41) Added new observation of FLUVAXDUE: 10/21/2010 (11/04/2009 9:41) Added new observation of DM PROGRESS: N/A (11/04/2009 9:41) Added new observation of DM FSREVIEW: N/A (11/04/2009 9:41) Added new observation of HTN PROGRESS: N/A (11/04/2009 9:41) Added new observation of HTN FSREVIEW: N/A (11/04/2009 9:41) Added new observation of LIPID PROGRS: N/A (11/04/2009 9:41) Added new observation of LIPID FSREVW: N/A (11/04/2009 9:41) Added new observation of FLU VAX#1VIS: 09/13/09 version given November 04, 2009. (11/04/2009 9:41) Added new observation of FLU VAXLOT: [id number]  (11/04/2009 9:41) Added new observation of FLU VAX EXP: 08/19/2010 (11/04/2009 9:41) Added new observation of FLU VAXBY: Kim Dance CMA (AAMA) (11/04/2009 9:41) Added new observation of FLU VAXRTE: IM (11/04/2009 9:41) Added new observation of FLU VAX DSE: 0.5 ml (11/04/2009 9:41) Added new observation of FLU VAXMFR: GlaxoSmithKline (11/04/2009 9:41) Added new observation of FLU VAX SITE: left deltoid (11/04/2009 9:41) Added new observation of FLU VAX: Fluvax 3+ (11/04/2009 9:41)       Immunizations Administered:  Influenza Vaccine # 1:    Vaccine Type: Fluvax 3+    Site: left deltoid    Mfr: GlaxoSmithKline    Dose: 0.5 ml    Route: IM    Given by: Selena Batten Dance CMA (AAMA)    Exp. Date: 08/19/2010    Lot #: [id number]    VIS given: 09/13/09 version given November 04, 2009.  Flu Vaccine Consent Questions:    Do you have a history of severe allergic reactions to this vaccine? no    Any prior history of allergic reactions to egg and/or gelatin? no    Do you have a sensitivity  to the preservative Thimersol? no    Do you have a past history of Guillan-Barre Syndrome? no    Do you currently have an acute febrile illness? no    Have you ever had a severe reaction to latex? no    Vaccine information given and explained to patient? yes    Are you currently pregnant? no    Prevention & Chronic Care Immunizations   Influenza vaccine: Fluvax 3+  (11/04/2009)   Influenza vaccine due: 10/21/2010    Tetanus booster: 12/14/2008: Tdap   Tetanus booster due: 12/15/2018    Pneumococcal vaccine: Pneumovax  (05/27/2009)   Pneumococcal vaccine deferral: Not indicated  (11/04/2009)    H. zoster vaccine: Not documented  Colorectal Screening   Hemoccult: Not documented    Colonoscopy: normal date approximate  (02/19/2002)  Other Screening   Pap smear: Not documented    Mammogram: Not documented    DXA bone density scan: Not documented   Smoking status: quit   (03/12/2007)  Lipids   Total Cholesterol: 213  (05/27/2009)   LDL: DEL  (09/18/2007)   LDL Direct: 149.1  (05/27/2009)   HDL: 40.90  (05/27/2009)   Triglycerides: 247.0  (05/27/2009)

## 2010-03-21 NOTE — Letter (Signed)
Summary: [facility]  [facility]   Imported By: Maryln Gottron 11/18/2009 12:46:23  _____________________________________________________________________  External Attachment:    Type:   Image     Comment:   External Document  Appended Document: [facility] no evidence of recurrent lung cancer

## 2010-03-23 NOTE — Assessment & Plan Note (Signed)
Summary: cold/nt   Vital Signs:  Patient profile:   65 year old female Height:      65 inches Weight:      161.25 pounds BMI:     26.93 Temp:     98.1 degrees F oral Pulse rate:   72 / minute Pulse rhythm:   regular Resp:     12 per minute BP sitting:   122 / 80  (left arm) Cuff size:   regular  Vitals Entered By: Delilah Shan CMA Duncan Dull) (February 14, 2010 11:09 AM) CC: Cold   History of Present Illness: Having chest conbgestion and cough Also with rhinorrhea Cough productive of green and yellow mucus No fever no SOB Intermittent sore throat or ear congestion with swallowing  Travelled to [place] last week--both got sick She got sick 4 days ago Husband also sick with bronchitis  Tried mucinex DM and claritin--not clearly helpful  also having pain over right hip has some stiffness but this seems different did just spend a busy time with toddler grandkids  Allergies: 1)  * Pain Meds  Past History:  Past medical, surgical, family and social histories (including risk factors) reviewed for relevance to current acute and chronic problems.  Past Medical History: Reviewed history from 05/26/2008 and no changes required. Lung cancer--mixed acinar and non-mucinous bronchoalveolar, Stage 1A GERD/EE Diverticulosis Internal hemorrhoisds Hyperplastic colon polyp 12/2002  Past Surgical History: Reviewed history from 09/18/2007 and no changes required. Arthroscopy L knee--09/12/06--[facility] Tonsillectomy--`1953 Tubal ligation May 1977 Left lower lobectomy 1/09   [facility]  Family History: Reviewed history from 05/26/2008 and no changes required. Father: died at 38 of bile duct cancer Mother: died at 30 of old age, heart problem Siblings: 1 half--MI at 38,  DM in mat uncle Family History of Breast Cancer: Aunt No FH of Colon Cancer:  Social History: Reviewed history from 05/26/2008 and no changes required. Marital Status: Married Children:  2--oldest son killed by  drunk driver,  [name], lives in [place]--new first grand daughter. 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  sister-in-law lives her Patient states former smoker. Quit 1985 Alcohol Use - yes-1-2 drinks per week Illicit Drug Use - no Patient gets regular exercise.  Review of Systems       No vomiting or diarrhea appetite is okay  Physical Exam  General:  alert.  NAD Head:  no sinus tenderness Ears:  R ear normal and L ear normal.   Nose:  mild inflammation and swelling Mouth:  mild pharyngeal injection and some isolated palatal redness Neck:  supple, no masses, no thyromegaly, and no cervical lymphadenopathy.   Lungs:  normal respiratory effort, no intercostal retractions, no accessory muscle use, and normal breath sounds.   Msk:  isolated tenderness over right anterior iliac crest   Impression & Recommendations:  Problem # 1:  BRONCHITIS- ACUTE (ICD-466.0) Assessment New  may still be viral advised on supportive Rx amoxil if worsens  Her updated medication list for this problem includes:    Amoxicillin 500 Mg Tabs (Amoxicillin) .Marland Kitchen... 2 tabs by mouth two times a day for bronchitis  Problem # 2:  BURSITIS, HIP (ICD-726.5) Assessment: New discussed antiinflammatories and ice cortisone shot if worsens  Complete Medication List: 1)  Nexium 40 Mg Cpdr (Esomeprazole magnesium) .... Take  one about 30 minutes before breakfast as needed 2)  Centrum Silver Tabs (Multiple vitamins-minerals) .... Take 1 tablet by mouth once a day 3)  Calcium-carb 600 + D 600-125 Mg-unit Tabs (Calcium-vitamin d) .Marland KitchenMarland KitchenMarland Kitchen  Once daily 4)  Fish Oil Oil (Fish oil) .... Once daily 5)  Zolpidem Tartrate 10 Mg Tabs (Zolpidem tartrate) .... One by mouth at bedtime as needed insomnia 6)  Amoxicillin 500 Mg Tabs (Amoxicillin) .... 2 tabs by mouth two times a day for bronchitis  Patient Instructions: 1)  Please start the antibiotic if you worsen or are not improving in the next week or so 2)  Please set up  Medicare Wellness Exam at your convenience Prescriptions: AMOXICILLIN 500 MG TABS (AMOXICILLIN) 2 tabs by mouth two times a day for bronchitis  #28 x 0   Entered and Authorized by:   Cindee Salt MD   Signed by:   Cindee Salt MD on 02/14/2010   Method used:   Print then Give to Patient   RxID:   [id number]    Orders Added: 1)  Est. Patient Level IV [14782]    Current Allergies (reviewed today): * PAIN MEDS

## 2010-05-04 LAB — POCT I-STAT, CHEM 8
BUN: 12 mg/dL (ref 6–23)
Creatinine, Ser: 0.8 mg/dL (ref 0.4–1.2)
Hemoglobin: 16 g/dL — ABNORMAL HIGH (ref 12.0–15.0)
Potassium: 4.1 mEq/L (ref 3.5–5.1)
Sodium: 141 mEq/L (ref 135–145)

## 2010-05-08 ENCOUNTER — Encounter: Payer: Medicare Other | Admitting: Internal Medicine

## 2010-05-08 ENCOUNTER — Ambulatory Visit (HOSPITAL_COMMUNITY)
Admission: RE | Admit: 2010-05-08 | Discharge: 2010-05-08 | Disposition: A | Discharge status: Home / Self Care | Payer: Medicare Other | Source: Ambulatory Visit | Attending: Internal Medicine | Admitting: Internal Medicine

## 2010-05-08 DIAGNOSIS — C3490 Malignant neoplasm of unspecified part of unspecified bronchus or lung: Secondary | ICD-10-CM | POA: Insufficient documentation

## 2010-05-08 DIAGNOSIS — M479 Spondylosis, unspecified: Secondary | ICD-10-CM | POA: Insufficient documentation

## 2010-05-08 LAB — POCT I-STAT, CHEM 8
Chloride: 105 mEq/L (ref 96–112)
Glucose, Bld: 99 mg/dL (ref 70–99)
HCT: 46 % (ref 36.0–46.0)
Potassium: 4.8 mEq/L (ref 3.5–5.1)

## 2010-05-08 MED ORDER — IOHEXOL 300 MG/ML  SOLN
80.0000 mL | Freq: Once | INTRAMUSCULAR | Status: AC | PRN
  Administered 2010-05-08: 80 mL via INTRAVENOUS

## 2010-05-10 ENCOUNTER — Other Ambulatory Visit: Payer: Self-pay | Admitting: Internal Medicine

## 2010-05-10 ENCOUNTER — Encounter (HOSPITAL_BASED_OUTPATIENT_CLINIC_OR_DEPARTMENT_OTHER): Payer: MEDICARE | Admitting: Internal Medicine

## 2010-05-10 DIAGNOSIS — C3430 Malignant neoplasm of lower lobe, unspecified bronchus or lung: Secondary | ICD-10-CM

## 2010-05-10 DIAGNOSIS — C3490 Malignant neoplasm of unspecified part of unspecified bronchus or lung: Secondary | ICD-10-CM

## 2010-07-04 ENCOUNTER — Encounter: Payer: Self-pay | Admitting: Internal Medicine

## 2010-07-04 NOTE — Assessment & Plan Note (Signed)
OFFICE VISIT   HOPIE, PELLEGRIN I  DOB:  October 16, 1945                                        May 19, 2008  CHART #:  [id number]   The patient came today, and her CT scan showed no evidence of recurrence  of her cancer, now approximately 1 year since her surgery, so we will  schedule another CT scan in 6 months and we will see her back again at  that time.  Her other problem she says that she just returned from  [place], was having lot of sinus congestion going now and then coming  back.  She has a purulent drainage, but no pain over maxillary sinuses  or ethmoidal sinus.  She has some throat pain, but no adenopathy.  Her  medical doctor had given her amoxicillin, so we gave her a prescription  for 7 days of Avelox, and I have told her to follow up with her medical  doctor if she does not start feeling better and also to take Claritin  for her congestion.   Ines Bloomer, M.D.  Electronically Signed   DPB/MEDQ  D:  05/19/2008  T:  05/20/2008  Job:  [id number]

## 2010-07-04 NOTE — H&P (Signed)
[name], [name]            ACCOUNT NO.:  [id number]   MEDICAL RECORD NO.:  [id number]          PATIENT TYPE:  INP   LOCATION:  NA                           FACILITY:  [facility]   PHYSICIAN:  Ines Bloomer, M.D. DATE OF BIRTH:  [birth year]   DATE OF ADMISSION:  DATE OF DISCHARGE:                              HISTORY & PHYSICAL   CHIEF COMPLAINT:  Lung mass.   HISTORY OF PRESENT ILLNESS:  A 65 year old patient who had a chest x-ray  and CT scan.  The chest x-ray was done in December.  She had a severe  coughing spell around Thanksgiving.  A second chest x-ray was done in  January and a CT scan on January 9 which revealed a 2.6 cm mass in the  right lower lobe with an SUV of 4.6 on a subsequent PET scan.  There  also was a right rib fracture on the right with an SUV of 4.1 which was  thought to be traumatic and related to her coughing spell in November.  She had some calcifications in her right breast and small sigmoid  diverticula.  She quit smoking in 1995.  Her FEV was 3.01 and an FEV1 of  2.4, 97% of predicted.  There is no hemoptysis, fever, chills or  excessive sputum.   PAST MEDICAL HISTORY:   MEDICATIONS:  Include Nexium 40 mg a day, vitamins and calcium.   ALLERGIES:  No allergies.  She had some nausea and vomiting to  anesthesia when she had right knee surgery in June of 2008.   FAMILY HISTORY:  Noncontributory.   SOCIAL HISTORY:  She is married and has two children.  She is a retired  Runner, broadcasting/film/video.  She quit smoking in 1985.  She does not drink alcohol on a  regular basis.   REVIEW OF SYSTEMS:  162 pounds.  She is 5 foot 5.  CARDIAC:  No angina  or atrial fibrillation.  PULMONARY:  She had a cough, no hemoptysis.  GI:  Reflux.  GU:  No kidney disease, dysuria or frequent urination.  VASCULAR:  No claudication, DVT, TIAs.  NEUROLOGICAL:  No headaches,  blackouts or seizures.  MUSCULOSKELETAL:  No arthritis, joint pain,  rash, or muscular pain.  PSYCHIATRIC:  No  psychiatric illness.  EYES/ENT:  No change in her eyesight or hearing.  HEMATOLOGICAL:  No  problems with bleeding, clotting disorders or anemia.   PHYSICAL EXAMINATION:  She is a slightly obese Caucasian female in no  acute distress.  Her blood pressure is 158/100, pulse 94, respirations 18, saturation  98%.  HEAD:  Atraumatic.  EYES:  Pupils equal, react to light and accommodation, extraocular  movements are normal.  EARS:  Tympanic membranes intact.  NOSE:  There is no septal deviation.  NECK:  Supple without thyromegaly.  There is no supraclavicular or  axillary adenopathy.  CHEST:  Clear to auscultation and percussion.  HEART:  Regular sinus rhythm, no murmurs.  ABDOMEN:  Soft.  There is no hepatosplenomegaly.  Pulses are 2+.  There is no clubbing or edema.  NEUROLOGIC:  She is oriented x3.  Sensory and  motor intact.  Cranial  nerves intact.  There is a right knee scar.   IMPRESSION:  1. Right lower lobe mass, nonsmall cell lung cancer.  2. Rib fracture.  3. Gastroesophageal reflux disease.   PLAN:  Right VATS,  right lower lobectomy.      Ines Bloomer, M.D.  Electronically Signed     DPB/MEDQ  D:  04/15/2007  T:  04/16/2007  Job:  [id number]

## 2010-07-04 NOTE — Letter (Signed)
May 21, 2007   Lajuana Matte, MD  [phone]  [place], [place] [address]   Re:  CONTINA, STRAIN I                  DOB:  March 15, 1945   Dear Arbutus Ped,   I saw Ms. Mikles back in the office today and chest x-ray is stable.  She is doing well with no particular problems.  She still has a small  effusion where we did a right lower lobe lobectomy and this should  gradually resolve over a period of time and we will see her back again  in 6 weeks with another chest x-ray.  We stopped her amiodarone and told  her to continue metoprolol for another month.  Her blood pressure was  116/72, pulse 64, respirations 18, sats 98%.  She was in normal sinus  rhythm.  Lungs were clear to auscultation percussion.   Ines Bloomer, M.D.  Electronically Signed   DPB/MEDQ  D:  05/21/2007  T:  05/21/2007  Job:  [id number]

## 2010-07-04 NOTE — Letter (Signed)
Jul 01, 2007   Lajuana Matte, MD  [phone] [address]  [place], [place] [address]   Re:  JARETSSI, KRAKER I                  DOB:  Sep 01, 1945   Dr. Arbutus Ped:   Ms. Klus came back today.  Her chest x-ray is stable with no  evidence of recurrence for cancer.  She just has a small pleural  effusion.  She has CT scan scheduled in September.  I gave her a refill  for Tussionex 180 mL with 2 refills.  Also, her lungs are clear to  auscultation and percussion.  When she coughs, she does have 1 area that  bulges posteriorly which I think is a small lung hernia which we will  keep an eye on.  Hopefully, this will improve.  I will see her back  again as scheduled.   Sincerely,   Ines Bloomer, M.D.  Electronically Signed   DPB/MEDQ  D:  07/01/2007  T:  07/01/2007  Job:  [id number]

## 2010-07-04 NOTE — Assessment & Plan Note (Signed)
OFFICE VISIT   [name], [name]  DOB:  [birth year]                                        November 11, 2007  CHART #:  [id number]   The patient returns today and we checked her incision and it looks like  this questionable lung hernia is resolved.  There is no evidence of any  bulging.  Her lungs were clear to auscultation and percussion.  Recent  CT scan showed no evidence of recurrence.  We will see you again in 6  months with another CT scan.  Her blood pressure is 126/86, pulse 88,  respirations 18, and sats were 98%.   Ines Bloomer, M.D.  Electronically Signed   DPB/MEDQ  D:  11/11/2007  T:  11/12/2007  Job:  [id number]

## 2010-07-04 NOTE — Letter (Signed)
April 30, 2007   Casimiro Needle B. Sherene Sires, MD, FCCP  [address]  [place] [address]   Re:  [name], [name] I                  DOB:  [birth year]   Dear Kathlene November:   [name] came for followup today.  Her blood pressure is 100/80,  pulse 78, respirations 20.  Saturations were e97%.  Incisions were well  healed.  Her CT scan after her right lower lobectomy just showed some  fluid in that space.  She is doing well overall.  I am referring her to  Dr. Arbutus Ped as she was a stage IB non-small cell lung cancer.  I said  she could start driving in a week and gradually increase her activity.  I removed her chest tube sutures.  I will see her back again in three  weeks with a chest x-ray.   Ines Bloomer, M.D.  Electronically Signed   DPB/MEDQ  D:  04/30/2007  T:  04/30/2007  Job:  [id number]   cc:   Lajuana Matte, MD

## 2010-07-04 NOTE — Letter (Signed)
March 25, 2007   Charlaine Dalton. Sherene Sires, MD, FCCP  [address]  [place], [place] [address]   Re:  [name], [name]              DOB:  [birth year]   Dear Kathlene November:   I appreciate the opportunity of seeing Ms [name]  .  This 64-  year-old patient had a chest x-ray and then subsequently a followup  chest x-ray and then a CT scan.  The first x-ray was done somewhere  around December.  She had had a severe coughing spell over Thanksgiving.  The second chest x-ray was done in January with  follow CT scan done on  February 28, 2007, and then had a PET scan done on March 17, 2007.  She  was found to have a 2 x 2.6 mass in the right lower lobe, which had an  uptake of 4.6 SUV.  She also had a rib fracture on the right with an SUV  of  4.1.  This was probably related to her previous coughing spell.  The  findings on the PET were the calcification in the right breast and a  small sigmoid diverticula.   She quit smoking in 1985.  Her pulmonary function tests showed an FEV of  3.01 with FEV1 of 2.40, both at 97% of predicted.  She has had no  hemoptysis, fevers, chills, excessive sputum.   MEDICATIONS:  1. Nexium 40 mg daily.  2. Vitamins.  3. Fish oil.  4. Calcium.  5. Vitamin D.   ALLERGIES:  She has no allergies.  She did have a reaction to anesthesia  with nausea and vomiting when she had knee surgery in June of 2008.   FAMILY HISTORY:  Noncontributory.   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  Does not drink alcohol on a regular  basis.   REVIEW OF SYSTEMS:  She is162 pounds.  She is 5 feet 5 inches.  CARDIAC:  No angina or atrial fibrillation.  PULMONARY:  She has had a productive cough, but no hemoptysis.  GI:  She has reflux.  GU:  No kidney disease, dysuria or frequent urination.  VASCULAR:  No claudication, DVTs or TIAs.  NEUROLOGICAL:  No headaches.  no seizuresMUSCULOSKELETAL:  No arthritis,  joint pain or  rash.  PSYCH:  No psychiatric illnesses.  EYES/ENT:  No change in her eyesight or her hearing.  HEMATOLOGICAL:  No bleeding, clotting disorders or anemia.   PHYSICAL EXAMINATION:  She is a well-developed Caucasian female in no  acute distress.  HEENT:  Unremarkable. Neck:  Supple without  thyromegaly.  There is no supraclavicular axial adenopathy.  Chest:  Clear to auscultation and percussion.  Heart:  Regular sinus rhythm with  no murmurs.  Abdomen:  Soft with no hepatosplenomegaly  Pulses are 2+.  There is no clubbing or edema.  Neurological:  She is oriented x3.  Sensory and motor are intact.  Cranial nerves intact.   Ms. All probably has a stage IB nonsmall cell lung cancer and I  have recommended that she right lower  lobectomy.  We will get a CT scan  of the brain prior to the surgery.  I have tentatively scheduled this  for February 26 at [facility].   Ines Bloomer, M.D.  Electronically Signed   DPB/MEDQ  D:  03/25/2007  T:  03/26/2007  Job:  [id number]

## 2010-07-04 NOTE — Assessment & Plan Note (Signed)
OFFICE VISIT   MASSIE, COGLIANO I  DOB:  1945/07/19                                        November 16, 2008  CHART #:  [id number]   The patient came today, and her CT scan showed no evidence of recurrence  of cancer.  She has another CT scan scheduled in 6 months and will be  followed by Dr. Arbutus Ped.  Her blood pressure was 124/76, pulse 78,  respirations 18, and sats were 98%.  I appreciate the opportunity of  seeing the patient.   Ines Bloomer, M.D.  Electronically Signed   DPB/MEDQ  D:  11/16/2008  T:  11/17/2008  Job:  [id number]

## 2010-07-04 NOTE — Op Note (Signed)
[name], [name]            ACCOUNT NO.:  [id number]   MEDICAL RECORD NO.:  [id number]          PATIENT TYPE:  INP   LOCATION:  3304                         FACILITY:  [facility]   PHYSICIAN:  Ines Bloomer, M.D. DATE OF BIRTH:  [birth year]   DATE OF PROCEDURE:  04/17/2007  DATE OF DISCHARGE:                               OPERATIVE REPORT   PREOPERATIVE DIAGNOSIS:  Right lower lobe mass.   POSTOPERATIVE DIAGNOSIS:  Right lower lobe mass.   OPERATION PERFORMED:  Right VATS, mini thoracotomy, and right lower  lobectomy with no dissection.   SURGEON:  Ines Bloomer, MD.   FIRST ASSISTANT:  Stephanie Acre. Dominick, PA-C.   ANESTHESIA:  General.   After percutaneous insertion of all monitor lines, the patient underwent  general anesthesia and was prepped and draped in the usual sterile  manner.  She was turned to the right lateral thoracotomy position, and  two trocar sites were made in the anterior and posterior axillary lines  at the seventh intercostal space sites.  A dual-lumen tube was inserted  and the right lung was deflated.  Two trocar sites, a 0-degree scope was  inserted and the patient had good lung functions with minimal  anthracosis.  No intrapleural tumor was seen.  A 7-cm incision was made  over the triangle with auscultation, the platysmas was partially  divided, the serratus was reflected anteriorly, the sixth intercostal  space was entered and biopsied.  The center of the inferior pulmonary  ligament was taken down with electrocautery, taking down some 8-R and 9-  R nodes, and then dissecting superiorly taking down seven nodes.  An  inferior pulmonary vein was dissected out, stapled, and divided with the  autosuture 45-gray with 2-mm Roticulator.  Attention was turned to the  fissure, the pulmonary artery was dissected out in the inferior portion,  and the fissure was divided with the autosuture green Roticulator.  This  exposed the pulmonary artery, which was  dissected free and divided with  an autosuture 2-mm gray, 30-mm Roticulator.  The superior pulmonary vein  was doubly-clipped and divided, then the superior portion of the fissure  was divided with the autosuture stapler using the green stapler.  The  bronchus was divided with the autosuture blue 30-mm stapler.  The right  lower lobe was removed.  Frozen section revealed a bronchoalveolar  cancer and negative margins.  There were a couple of areas of air leak  in the fissure, which were oversewn with #3-0 Vicryl, and then CoSeal  was applied to the staple line.  A right-angle chest tube was placed  posteriorly and a straight chest tube anteriorly.  A single ON-Q was  placed in the usual fashion, a Marcaine  block was done in the usual fashion.  The chest was closed with four  pericostals of #1 Vicryl drilling through the seventh rib and passing  around the sixth rib, a #1 Vicryl in the muscle layer, a #2-0 Vicryl in  the subcutaneous tissue, and Dermabond for the skin.  The patient was  returned to the recovery room in stable condition.  Ines Bloomer, M.D.  Electronically Signed     DPB/MEDQ  D:  04/17/2007  T:  04/17/2007  Job:  [id number]   cc:   Ines Bloomer, M.D.  2 copies

## 2010-07-04 NOTE — Letter (Signed)
March 25, 2007   Michael B. Wert, MD, FCCP  [address]  [place], [place] [address]   Re:  [name], [name]              DOB:  07/06/1945   Dear Mike:   I appreciate the opportunity of seeing [name]  .  This 59-  year-old patient had a chest x-ray and then subsequently a followup  chest x-ray and then a CT scan.  The first x-ray was done somewhere  around December.  She had had a severe coughing spell over Thanksgiving.  The second chest x-ray was done in January with  follow CT scan done on  February 28, 2007, and then had a PET scan done on March 17, 2007.  She  was found to have a 2 x 2.6 mass in the right lower lobe, which had an  uptake of 4.6 SUV.  She also had a rib fracture on the right with an SUV  of  4.1.  This was probably related to her previous coughing spell.  The  findings on the PET were the calcification in the right breast and a  small sigmoid diverticula.   She quit smoking in 1985.  Her pulmonary function tests showed an FEV of  3.01 with FEV1 of 2.40, both at 97% of predicted.  She has had no  hemoptysis, fevers, chills, excessive sputum.   MEDICATIONS:  1. Nexium 40 mg daily.  2. Vitamins.  3. Fish oil.  4. Calcium.  5. Vitamin D.   ALLERGIES:  She has no allergies.  She did have a reaction to anesthesia  with nausea and vomiting when she had knee surgery in June of 2008.   FAMILY HISTORY:  Noncontributory.   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  Does not drink alcohol on a regular  basis.   REVIEW OF SYSTEMS:  She is162 pounds.  She is 5 feet 5 inches.  CARDIAC:  No angina or atrial fibrillation.  PULMONARY:  She has had a productive cough, but no hemoptysis.  GI:  She has reflux.  GU:  No kidney disease, dysuria or frequent urination.  VASCULAR:  No claudication, DVTs or TIAs.  NEUROLOGICAL:  No headaches.  no seizuresMUSCULOSKELETAL:  No arthritis,  joint pain or  rash.  PSYCH:  No psychiatric illnesses.  EYES/ENT:  No change in her eyesight or her hearing.  HEMATOLOGICAL:  No bleeding, clotting disorders or anemia.   PHYSICAL EXAMINATION:  She is a well-developed Caucasian female in no  acute distress.  HEENT:  Unremarkable. Neck:  Supple without  thyromegaly.  There is no supraclavicular axial adenopathy.  Chest:  Clear to auscultation and percussion.  Heart:  Regular sinus rhythm with  no murmurs.  Abdomen:  Soft with no hepatosplenomegaly  Pulses are 2+.  There is no clubbing or edema.  Neurological:  She is oriented x3.  Sensory and motor are intact.  Cranial nerves intact.   Adrienne. [name] probably has a stage IB nonsmall cell lung cancer and I  have recommended that she right lower  lobectomy.  We will get a CT scan  of the brain prior to the surgery.  I have tentatively scheduled this  for February 26 at [place].   Donald P. Burney, M.D.  Electronically Signed   DPB/MEDQ  D:  03/25/2007  T:  03/26/2007  Job:  [id number] 

## 2010-07-04 NOTE — Discharge Summary (Signed)
[name], [name]            ACCOUNT NO.:  [id number]   MEDICAL RECORD NO.:  [id number]          PATIENT TYPE:  INP   LOCATION:  3304                         FACILITY:  [facility]   PHYSICIAN:  Ines Bloomer, M.D. DATE OF BIRTH:  [birth year]   DATE OF ADMISSION:  04/17/2007  DATE OF DISCHARGE:  04/23/2007                               DISCHARGE SUMMARY   FINAL DIAGNOSES:  1. Right lower lobe mass, adenocarcinoma T1, N0, MX.   IN HOSPITAL DIAGNOSES:  1. Postoperative atrial fibrillation.  2. Elevated TSH.   SECONDARY DIAGNOSIS:  Gastroesophageal reflux disease.   IN HOSPITAL OPERATIONS AND PROCEDURES:  Right video-assisted  thoracoscopic surgery with mini thoracotomy, right lower lobectomy with  node dissection.   HISTORY OF PRESENT ILLNESS/HOSPITAL COURSE:  The patient is a 65-year-  old female who had chest x-ray in December due to having severe coughing  spell around Thanksgiving.  Second chest x-ray done in January revealed  a 2.6 cm mass in the right lower lobe.  CT scan confirmed this mass.  PET scan done showed an SUV uptake of 4.6.  There was also a right rib  fracture on the right with an SUV 4.1.  This was thought to be traumatic  and related to her coughing spell.  The patient had some calcifications  in the right breast and small sigmoid diverticulitis.  She quit smoking  in 1995.  Her FEV was 3.01, FEV1 2.4 97% predicted.  She denies  hemoptysis, fevers, chills or excessive sputum.  The patient was seen  and evaluated by Dr. Edwyna Shell.  Dr. Edwyna Shell discussed with the patient  undergoing right thoracotomy with right lower lobectomy.  He discussed  risks and benefits with the patient.  The patient nods her understanding  and agreed to proceed.  Surgery was scheduled for April 17, 2007.  For details of the patient's Past Medical History and Physical Exam  please see dictated H&P.   The patient was taken to the operating room April 17, 2007 where she  underwent  right video-assisted thoracoscopic surgery with mini  thoracotomy, right lower lobectomy with lymph node dissection.  This  showed to be positive for adenocarcinoma T1, N0, MX.  The patient  tolerated this procedure well, and was transferred to the intensive care  unit in stable condition.  Postoperatively the patient was able to be  extubated.  Post extubation she was noted to be alert and oriented x4,  neurologically intact.  She was noted to be hemodynamically stable  postoperatively.  The patient's postoperative course she had daily chest  x-rays obtained.  These x-rays remained stable with a right upper space  and small pleural effusion.  Chest tube was able to be DC'd on postop  day #1.  The following day no air leak was noted, and chest x-ray  remained stable.  Remaining chest tube was DC'd.  Follow-up chest x-ray  showed no increase in size of the right upper space.  She still had  persistent small amount of right pleural effusion.  The patient was  encouraged to use her incentive spirometer, and she was able to  be  weaned off oxygen satting greater than 90% on room air.  During the  patient's postoperative course her vital signs were monitored closely.  On postop day #4 the patient went into rapid atrial fibrillation with a  heart rate of 120.  She was started on IV amiodarone as well as low dose  beta blocker.  After initiating these two medications the patient  converted back to normal sinus rhythm.  She was switched from IV  amiodarone to p.o. amiodarone the following day.  Heart rate and blood  pressure remained stable on these two medications, and she remained in  normal sinus rhythm the remainder of her postoperative course.  The  patient's TSH was checked during this time.  It was noted to be slightly  elevated at 5.786.  The patient has no history of any thyroid disease.  Plan to follow up with this as an outpatient.  Postoperatively the  patient is out of bed ambulating  without difficulty.  She was tolerating  diet well.  No nausea, vomiting noted.  She remained afebrile during her  postoperative course.   On April 23, 2007 the patient was noted to be afebrile.  Heart rate and  blood pressure remained stable.  She is satting greater than 90% on room  air.  Labs showed sodium of 138, potassium 3.9, chloride 103, bicarb 30,  BUN 7, creatinine 0.78, glucose 130.  CBC done on March 1 showed a white  count 9.3, hemoglobin 11.8, hematocrit 34.6, platelet count 246.  TSH  was 5.786.  Chest x-ray showed a right upper space with a small right  pleural effusion.  The patient was felt to be progressing well and ready  for discharge home today April 23, 2007.   FOLLOW-UP APPOINTMENTS:  Follow-up appointment has been arranged with  Dr. Edwyna Shell for April 30, 2007 at 9:15 a.m..  The patient will need to  obtain PMI or chest x-ray 30 minutes prior to this appointment.   ACTIVITY:  Patient instructed no driving until released to do so, no  heavy lifting over 10 pounds.  She is told to ambulate 3-4 times per  day, progress as tolerated, and to continue her breathing exercises.   INCISIONAL CARE:  The patient is told to shower washing her incisions  using soap and water.  She is to contact the office if she develops any  drainage or opening from any of her incision sites.   DIET:  The patient is educated on diet to be low fat, low salt.   DISCHARGE MEDICATIONS:  1. Nexium 40 mg daily.  2. Fish oil daily.  3. Calcium and vitamin D daily.  4. Fiber tablet daily.  5. Multivitamin daily.  6. Amiodarone 200 mg b.i.d..  7. Toprol XL 25 mg daily.  8. Percocet 5/325 1-2 tablets q.4-6 h. P.r.n.      Theda [place], [place]      Ines Bloomer, M.D.  Electronically Signed    KMD/MEDQ  D:  04/23/2007  T:  04/23/2007  Job:  [id number]

## 2010-07-05 ENCOUNTER — Encounter: Payer: Self-pay | Admitting: Internal Medicine

## 2010-07-05 ENCOUNTER — Ambulatory Visit (INDEPENDENT_AMBULATORY_CARE_PROVIDER_SITE_OTHER): Payer: MEDICARE | Admitting: Internal Medicine

## 2010-07-05 VITALS — BP 100/70 | HR 80 | Temp 98.5°F | Ht 65.0 in | Wt 148.0 lb

## 2010-07-05 DIAGNOSIS — K219 Gastro-esophageal reflux disease without esophagitis: Secondary | ICD-10-CM

## 2010-07-05 DIAGNOSIS — K1370 Unspecified lesions of oral mucosa: Secondary | ICD-10-CM

## 2010-07-05 DIAGNOSIS — Z13220 Encounter for screening for lipoid disorders: Secondary | ICD-10-CM

## 2010-07-05 DIAGNOSIS — C3490 Malignant neoplasm of unspecified part of unspecified bronchus or lung: Secondary | ICD-10-CM

## 2010-07-05 LAB — CBC WITH DIFFERENTIAL/PLATELET
Basophils Absolute: 0 10*3/uL (ref 0.0–0.1)
Eosinophils Absolute: 0.1 10*3/uL (ref 0.0–0.7)
Hemoglobin: 15.6 g/dL — ABNORMAL HIGH (ref 12.0–15.0)
Lymphocytes Relative: 25.5 % (ref 12.0–46.0)
MCHC: 35 g/dL (ref 30.0–36.0)
Neutro Abs: 4.4 10*3/uL (ref 1.4–7.7)
Neutrophils Relative %: 67 % (ref 43.0–77.0)
Platelets: 263 10*3/uL (ref 150.0–400.0)
RDW: 12.4 % (ref 11.5–14.6)

## 2010-07-05 LAB — HEPATIC FUNCTION PANEL
ALT: 18 U/L (ref 0–35)
AST: 18 U/L (ref 0–37)
Alkaline Phosphatase: 59 U/L (ref 39–117)
Bilirubin, Direct: 0.1 mg/dL (ref 0.0–0.3)
Total Protein: 6.2 g/dL (ref 6.0–8.3)

## 2010-07-05 LAB — BASIC METABOLIC PANEL
CO2: 30 mEq/L (ref 19–32)
Chloride: 106 mEq/L (ref 96–112)
Potassium: 5.1 mEq/L (ref 3.5–5.1)
Sodium: 141 mEq/L (ref 135–145)

## 2010-07-05 NOTE — Progress Notes (Signed)
  Subjective:    Patient ID: [name], female    DOB: 06-23-1945, 65 y.o.   MRN: [id number]  HPI Has had pimple under tongue Noticed about 2 weeks ago after eating Noticed it first while on cruise Gets bigger after eating  No pain No drainage but may taste something there Did have swollen gland while on ship  Current outpatient prescriptions:calcium-vitamin D (OSCAL WITH D) 500-200 MG-UNIT per tablet, Take 1 tablet by mouth daily.  , Disp: , Rfl: ;  esomeprazole (NEXIUM) 40 MG capsule, Take 40 mg by mouth daily before breakfast.  , Disp: , Rfl: ;  fish oil-omega-3 fatty acids 1000 MG capsule, Take 2 g by mouth daily.  , Disp: , Rfl: ;  Multiple Vitamins-Minerals (CENTRUM SILVER PO), Take by mouth daily.  , Disp: , Rfl:  zolpidem (AMBIEN) 10 MG tablet, Take 10 mg by mouth at bedtime as needed.  , Disp: , Rfl:   Past Medical History  Diagnosis Date  . 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 GERD (gastroesophageal reflux disease)   . Diverticulitis   . Internal hemorrhoid   . Hyperplastic colon polyp     Past Surgical History  Procedure Date  . Knee arthroscopy     left  . Tonsillectomy   . Tubal ligation   . Lobectomy     left lower    Family History  Problem Relation Age of Onset  . Heart disease Mother   . Diabetes Maternal Uncle   . Cancer Neg Hx     History   Social History  . Marital Status: Married    Spouse Name: N/A    Number of Children: 2  . Years of Education: N/A   Occupational History  . retired- Editor, commissioning    Social History Main Topics  . Smoking status: Former Smoker    Quit date: 02/20/1983  . Smokeless tobacco: Not on file  . Alcohol Use: Yes  . Drug Use: No  . Sexually Active: Not on file   Other Topics Concern  . 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   sister-in-law lives herPatient gets regular exercise.   Review of Systems No fever No swallowing problems Smoked briefly but has been off for many years    Objective:   Physical Exam  Constitutional: She appears well-developed and well-nourished. No distress.  HENT:  Mouth/Throat: Oropharynx is clear and moist. No oropharyngeal exudate.       Isolated pearly cyst at base of frenulum No induration No inflammation          Assessment & Plan:

## 2010-07-07 ENCOUNTER — Encounter: Payer: Self-pay | Admitting: Internal Medicine

## 2010-07-07 ENCOUNTER — Ambulatory Visit (INDEPENDENT_AMBULATORY_CARE_PROVIDER_SITE_OTHER): Payer: MEDICARE | Admitting: Internal Medicine

## 2010-07-07 VITALS — BP 119/72 | HR 73 | Temp 98.5°F | Ht 65.0 in | Wt 148.0 lb

## 2010-07-07 DIAGNOSIS — K219 Gastro-esophageal reflux disease without esophagitis: Secondary | ICD-10-CM

## 2010-07-07 DIAGNOSIS — Z0000 Encounter for general adult medical examination without abnormal findings: Secondary | ICD-10-CM

## 2010-07-07 DIAGNOSIS — G479 Sleep disorder, unspecified: Secondary | ICD-10-CM

## 2010-07-07 DIAGNOSIS — C3490 Malignant neoplasm of unspecified part of unspecified bronchus or lung: Secondary | ICD-10-CM

## 2010-07-07 DIAGNOSIS — H9190 Unspecified hearing loss, unspecified ear: Secondary | ICD-10-CM

## 2010-07-07 MED ORDER — ZOLPIDEM TARTRATE 10 MG PO TABS: 10.0000 mg | ORAL_TABLET | Freq: Every evening | ORAL | Status: DC | PRN

## 2010-07-07 NOTE — Progress Notes (Signed)
Subjective:    Patient ID: [name], female    DOB: February 12, 1946, 65 y.o.   MRN: [id number]  HPI Medicare wellness Form reviewed Mild hearing problems---trouble in a crowded room  Still sees gyn--appt next week Dr Toya Smothers Mammogram last year Pap with gyn  Still has the mass in mouth Still fluctuates with meals  Sleep still isn't great No sig daytime somnolence--doesn't like to nap Tried melatonin--not much good Uses ambien very rarely (once every 1-2 weeks)  Stomach has been fine Only uses nexium as needed  Regular follow up with oncologist---yearly now Gets CT scan  Current outpatient prescriptions:calcium-vitamin D (OSCAL WITH D) 500-200 MG-UNIT per tablet, Take 1 tablet by mouth daily.  , Disp: , Rfl: ;  esomeprazole (NEXIUM) 40 MG capsule, Take 40 mg by mouth daily before breakfast.  , Disp: , Rfl: ;  fish oil-omega-3 fatty acids 1000 MG capsule, Take 2 g by mouth daily.  , Disp: , Rfl: ;  Multiple Vitamins-Minerals (CENTRUM SILVER PO), Take by mouth daily.  , Disp: , Rfl:  zolpidem (AMBIEN) 10 MG tablet, Take 10 mg by mouth at bedtime as needed.  , Disp: , Rfl:   Past Medical History  Diagnosis Date  . 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 GERD (gastroesophageal reflux disease)   . Diverticulitis   . Internal hemorrhoid   . Hyperplastic colon polyp     Past Surgical History  Procedure Date  . Knee arthroscopy     left  . Tonsillectomy   . Tubal ligation   . Lobectomy     left lower    Family History  Problem Relation Age of Onset  . Heart disease Mother   . Diabetes Maternal Uncle   . Cancer Neg Hx     History   Social History  . Marital Status: Married    Spouse Name: N/A    Number of Children: 2  . Years of Education: N/A   Occupational History  . retired- Editor, commissioning    Social History Main Topics  . Smoking status: Former Smoker    Quit date: 02/20/1983  . Smokeless tobacco: Not on file   . Alcohol Use: Yes  . Drug Use: No  . Sexually Active: Not on file   Other Topics Concern  . 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  sister-in-law lives herPatient gets regular exercise.     Review of Systems Bowels fine Weight stable Bowels fine No urinary incontinence    Objective:   Physical Exam  Constitutional: She appears well-developed and well-nourished. No distress.  HENT:  Mouth/Throat: Oropharynx is clear and moist. No oropharyngeal exudate.       Still with 2mm mass by frenulum  Neck: Normal range of motion. Neck supple. No thyromegaly present.  Cardiovascular: Normal rate, regular rhythm and normal heart sounds.  Exam reveals no gallop.   No murmur heard. Pulmonary/Chest: Effort normal and breath sounds normal. No respiratory distress. She has no wheezes. She has no rales.  Abdominal: Soft. There is no tenderness.  Musculoskeletal: Normal range of motion. She exhibits no edema and no tenderness.  Lymphadenopathy:    She has no cervical adenopathy.  Neurological:       President--"Obama, Bush, Clinton" [id number]  Psychiatric: She has a normal mood and affect. Her  behavior is normal. Judgment and thought content normal.          Assessment & Plan:  I have personally reviewed the Medicare Annual Wellness questionnaire and have noted 1. The patient's medical and social history 2. Their use of alcohol, tobacco or illicit drugs 3. Their current medications and supplements 4. The patient's functional ability including ADL's, fall risks, home safety risks and hearing or visual             impairment. 5. Diet and physical activities 6. Evidence for depression or mood disorders  The patients weight, height, BMI and visual acuity have been recorded in the chart I have made referrals, counseling and provided  education to the patient based review of the above and I have provided the pt with a written personalized care plan for preventive services.  I have provided you with a copy of your personalized plan for preventive services. Please take the time to review along with your updated medication list.

## 2010-09-06 ENCOUNTER — Ambulatory Visit: Payer: Self-pay | Admitting: Obstetrics and Gynecology

## 2010-09-11 ENCOUNTER — Telehealth: Payer: Self-pay | Admitting: *Deleted

## 2010-09-11 MED ORDER — METHOCARBAMOL 500 MG PO TABS: 500.0000 mg | ORAL_TABLET | Freq: Three times a day (TID) | ORAL | Status: DC | PRN

## 2010-09-11 NOTE — Telephone Encounter (Signed)
Okay to send Rx for methocarbamol 500mg  tid prn #30 x 0 If she has ongoing problems, esp with radiating pain, she should make an appt

## 2010-09-11 NOTE — Telephone Encounter (Signed)
rx sent to pharmacy by e-script Spoke with patient and advised results   

## 2010-09-11 NOTE — Telephone Encounter (Signed)
Patient says that she was making her bed one day last week and decided to change her sheets and when she changes the sheets she has to move a couch that is blocking her bed. She says that when she moved the couch she turned a certain way, and she feels like she pulled a muscle in her back. She is asking if she can get a rx for a muscle relaxer called in. Uses [facility].

## 2010-11-10 LAB — BASIC METABOLIC PANEL
BUN: 8
Chloride: 103
Glucose, Bld: 145 — ABNORMAL HIGH
Potassium: 3.7

## 2010-11-10 LAB — CBC
HCT: 37.5
HCT: 39.4
HCT: 44.3
Hemoglobin: 15.2 — ABNORMAL HIGH
MCHC: 34
MCHC: 34.3
MCV: 84.9
MCV: 85.4
Platelets: 250
Platelets: 255
RBC: 5.21 — ABNORMAL HIGH
RDW: 13.1
RDW: 13.2
WBC: 6.7

## 2010-11-10 LAB — COMPREHENSIVE METABOLIC PANEL
AST: 18
Albumin: 2.9 — ABNORMAL LOW
Alkaline Phosphatase: 50
BUN: 6
CO2: 25
Chloride: 109
Creatinine, Ser: 0.7
GFR calc Af Amer: >60
GFR calc non Af Amer: >60
Glucose, Bld: 107 — ABNORMAL HIGH
Potassium: 4
Sodium: 138
Total Bilirubin: 0.4
Total Protein: 5.6 — ABNORMAL LOW

## 2010-11-10 LAB — BLOOD GAS, ARTERIAL
Acid-Base Excess: 0.7
Acid-base deficit: 0.1
Bicarbonate: 23.4
Drawn by: 274481
O2 Saturation: 92.3
O2 Saturation: 96.9
Patient temperature: 98
TCO2: 24.4
pCO2 arterial: 33.5 — ABNORMAL LOW
pO2, Arterial: 86.8

## 2010-11-10 LAB — URINALYSIS, ROUTINE W REFLEX MICROSCOPIC
Bilirubin Urine: NEGATIVE
Glucose, UA: NEGATIVE
Ketones, ur: NEGATIVE
Nitrite: NEGATIVE
Protein, ur: NEGATIVE
pH: 5.5

## 2010-11-10 LAB — TYPE AND SCREEN

## 2010-11-10 LAB — PROTIME-INR: Prothrombin Time: 12.5

## 2010-11-10 LAB — APTT: aPTT: 21 — ABNORMAL LOW

## 2010-11-13 LAB — BASIC METABOLIC PANEL
BUN: 7
CO2: 30
Calcium: 8.1 — ABNORMAL LOW
Calcium: 8.8
Creatinine, Ser: 0.78
GFR calc non Af Amer: >60
Glucose, Bld: 103 — ABNORMAL HIGH
Glucose, Bld: 108 — ABNORMAL HIGH
Sodium: 138
Sodium: 139

## 2010-11-13 LAB — CBC
HCT: 34.6 — ABNORMAL LOW
Hemoglobin: 11.8 — ABNORMAL LOW
MCHC: 34.1
MCV: 85.9
RDW: 13.2

## 2010-12-13 ENCOUNTER — Ambulatory Visit (INDEPENDENT_AMBULATORY_CARE_PROVIDER_SITE_OTHER): Payer: MEDICARE

## 2010-12-13 DIAGNOSIS — Z23 Encounter for immunization: Secondary | ICD-10-CM

## 2011-02-14 ENCOUNTER — Encounter: Payer: Self-pay | Admitting: Family Medicine

## 2011-02-14 ENCOUNTER — Ambulatory Visit (INDEPENDENT_AMBULATORY_CARE_PROVIDER_SITE_OTHER)
Admission: RE | Admit: 2011-02-14 | Discharge: 2011-02-14 | Disposition: A | Discharge status: Home / Self Care | Payer: MEDICARE | Source: Ambulatory Visit | Attending: Family Medicine | Admitting: Family Medicine

## 2011-02-14 ENCOUNTER — Ambulatory Visit (INDEPENDENT_AMBULATORY_CARE_PROVIDER_SITE_OTHER): Payer: MEDICARE | Admitting: Family Medicine

## 2011-02-14 VITALS — BP 130/88 | HR 78 | Temp 98.0°F | Ht 65.0 in | Wt 152.2 lb

## 2011-02-14 DIAGNOSIS — M545 Low back pain, unspecified: Secondary | ICD-10-CM

## 2011-02-14 MED ORDER — MELOXICAM 15 MG PO TABS: 15.0000 mg | ORAL_TABLET | Freq: Every day | ORAL | Status: DC

## 2011-02-14 MED ORDER — CYCLOBENZAPRINE HCL 10 MG PO TABS: 10.0000 mg | ORAL_TABLET | Freq: Three times a day (TID) | ORAL | Status: AC | PRN

## 2011-02-14 NOTE — Assessment & Plan Note (Signed)
Intermittent -  Currently on L side and worse than usual / positional  No neurologic red flags Films today - hx of ? Mild deg disk Suspect spasm Trial of heat/ mobic/ flexeril  May consider PT when xray report returns

## 2011-02-14 NOTE — Patient Instructions (Signed)
Use heat on back - 10 minutes at a time Slow walking is good  X rays today - will call you tomorrow  mobic one pill daily with a meal (stop the aleve)  Flexeril is muscle relaxer- use with caution due to sedation Will make plan when xray returns

## 2011-02-14 NOTE — Progress Notes (Signed)
Subjective:    Patient ID: [name], female    DOB: 12/16/1945, 65 y.o.   MRN: [id number]  HPI Here for back pain that is low left sided - and rad into L hip area - for about 10 days Worse when she sits and when she gets up from sitting  Is stiff to get out of bed in am -- then worse as the day goes  Is dull right now -- but at times is shooting and sharp Is radiating down her L leg a bit - but not down to ankle No numbness or loss of strength No loss of bladder control  Has recently been traveling from [place] Sitting in the plane was really painful   Has been going to a chiropractor for about 6 months  Did some xrays  Maybe some subtle changes of disc dz?= unsure   Has been taking some aleve  Using both heat and ice on different days      At the same time had some gassiness in low abdomen   She has had back issues before  In past moreso on R side but has moved around  Never this much pain before   Patient Active Problem List  Diagnoses  . NEOPLASM, MALIGNANT, LUNG  . INTERNAL HEMORRHOIDS  . GERD  . DIVERTICULOSIS, COLON  . SLEEP DISORDER  . POSITIVE PPD  . COLONIC POLYPS, BENIGN, HX OF  . Pain, low back   Past Medical History  Diagnosis Date  . 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 GERD (gastroesophageal reflux disease)   . Diverticulitis   . Internal hemorrhoid   . Hyperplastic colon polyp    Past Surgical History  Procedure Date  . Knee arthroscopy     left  . Tonsillectomy   . Tubal ligation   . Lobectomy     left lower   History  Substance Use Topics  . Smoking status: Former Smoker    Quit date: 02/20/1983  . Smokeless tobacco: Not on file  . Alcohol Use: Yes   Family History  Problem Relation Age of Onset  . Heart disease Mother   . Diabetes Maternal Uncle   . Cancer Neg Hx    No Known Allergies Current Outpatient Prescriptions on File Prior to Visit  Medication Sig Dispense Refill  .  calcium-vitamin D (OSCAL WITH D) 500-200 MG-UNIT per tablet Take 1 tablet by mouth daily.        . fish oil-omega-3 fatty acids 1000 MG capsule Take 2 g by mouth daily.        . Multiple Vitamins-Minerals (CENTRUM SILVER PO) Take by mouth daily.        Marland Kitchen zolpidem (AMBIEN) 10 MG tablet Take 1 tablet (10 mg total) by mouth at bedtime as needed.  30 tablet  0  . esomeprazole (NEXIUM) 40 MG capsule Take 40 mg by mouth daily before breakfast.              Review of Systems Review of Systems  Constitutional: Negative for fever, appetite change, fatigue and unexpected weight change.  Eyes: Negative for pain and visual disturbance.  Respiratory: Negative for cough and shortness of breath.   Cardiovascular: Negative for cp or palpitations    Gastrointestinal: Negative for nausea, diarrhea and constipation.  Genitourinary: Negative for urgency and frequency.  Skin: Negative for pallor or rash   MSK pos for back and buttock pain , neg for joint  swelling or redness Neurological: Negative for weakness, light-headedness, numbness and headaches.  Hematological: Negative for adenopathy. Does not bruise/bleed easily.  Psychiatric/Behavioral: Negative for dysphoric mood. The patient is not nervous/anxious.          Objective:   Physical Exam  Constitutional: She appears well-developed and well-nourished. No distress.  HENT:  Head: Normocephalic and atraumatic.  Eyes: Conjunctivae and EOM are normal. Pupils are equal, round, and reactive to light.  Neck: Normal range of motion. Neck supple.  Cardiovascular: Normal rate, regular rhythm, normal heart sounds and intact distal pulses.   Pulmonary/Chest: Effort normal and breath sounds normal. No respiratory distress. She has no wheezes.  Abdominal:       No suprapubic tenderness    Musculoskeletal: She exhibits no edema.       LS - no bony tenderness L buttock and peri lumbar tenderness and spasm palpable No lordosis or scoliosis  Nl rom hips SLR  on R yields L sided back pain only (otherwise nl) Spine flex 20 deg -pain , and ext none Also pain on L lateral flex    Lymphadenopathy:    She has no cervical adenopathy.  Neurological: She is alert. She has normal strength and normal reflexes. No sensory deficit. She exhibits normal muscle tone. Coordination normal.  Skin: Skin is warm and dry. No rash noted. No erythema. No pallor.  Psychiatric: She has a normal mood and affect.          Assessment & Plan:

## 2011-02-15 ENCOUNTER — Telehealth: Payer: Self-pay | Admitting: Family Medicine

## 2011-02-15 DIAGNOSIS — M545 Low back pain: Secondary | ICD-10-CM

## 2011-02-15 NOTE — Telephone Encounter (Signed)
Message copied by Judy Pimple on Thu Feb 15, 2011  6:58 PM ------      Message from: Patience Musca      Created: Thu Feb 15, 2011  3:03 PM       Patient notified as instructed by telephone. Pt is agreeable for PT referral and will wait to hear from pt care coordinator.

## 2011-02-15 NOTE — Telephone Encounter (Signed)
Ref done  

## 2011-02-23 ENCOUNTER — Telehealth: Payer: Self-pay | Admitting: Internal Medicine

## 2011-02-23 NOTE — Telephone Encounter (Signed)
S/w the pt and she is aware of the md appt in march

## 2011-03-09 ENCOUNTER — Other Ambulatory Visit: Payer: Self-pay

## 2011-03-09 MED ORDER — CYCLOBENZAPRINE HCL 10 MG PO TABS: 10.0000 mg | ORAL_TABLET | Freq: Three times a day (TID) | ORAL | Status: DC | PRN

## 2011-03-09 NOTE — Telephone Encounter (Signed)
[facility] faxed refill request for Cyclobenzaprine 10 mg. Med last filled 02/14/11. Pt last seen 02/14/11. Unable to reach pt to see if back pain is better.Please advise.

## 2011-03-09 NOTE — Telephone Encounter (Signed)
Will refill electronically  

## 2011-05-01 ENCOUNTER — Other Ambulatory Visit: Payer: Self-pay | Admitting: *Deleted

## 2011-05-01 MED ORDER — ZOLPIDEM TARTRATE 10 MG PO TABS: 10.0000 mg | ORAL_TABLET | Freq: Every evening | ORAL | Status: DC | PRN

## 2011-05-01 NOTE — Telephone Encounter (Signed)
Px written for call in   

## 2011-05-02 NOTE — Telephone Encounter (Signed)
Medication phoned to [facility] pharmacy as instructed.  

## 2011-05-07 ENCOUNTER — Other Ambulatory Visit (HOSPITAL_BASED_OUTPATIENT_CLINIC_OR_DEPARTMENT_OTHER): Payer: MEDICARE | Admitting: Lab

## 2011-05-07 ENCOUNTER — Ambulatory Visit (HOSPITAL_COMMUNITY)
Admission: RE | Admit: 2011-05-07 | Discharge: 2011-05-07 | Disposition: A | Discharge status: Home / Self Care | Payer: MEDICARE | Source: Ambulatory Visit | Attending: Internal Medicine | Admitting: Internal Medicine

## 2011-05-07 ENCOUNTER — Encounter (HOSPITAL_COMMUNITY): Payer: Self-pay

## 2011-05-07 DIAGNOSIS — C3490 Malignant neoplasm of unspecified part of unspecified bronchus or lung: Secondary | ICD-10-CM

## 2011-05-07 DIAGNOSIS — R059 Cough, unspecified: Secondary | ICD-10-CM | POA: Insufficient documentation

## 2011-05-07 DIAGNOSIS — R05 Cough: Secondary | ICD-10-CM | POA: Insufficient documentation

## 2011-05-07 DIAGNOSIS — C3430 Malignant neoplasm of lower lobe, unspecified bronchus or lung: Secondary | ICD-10-CM

## 2011-05-07 LAB — CBC WITH DIFFERENTIAL/PLATELET
EOS%: 3.8 % (ref 0.0–7.0)
LYMPH%: 23.2 % (ref 14.0–49.7)
MCH: 29 pg (ref 25.1–34.0)
MCV: 84 fL (ref 79.5–101.0)
MONO%: 9.2 % (ref 0.0–14.0)
RBC: 5.1 10*6/uL (ref 3.70–5.45)
RDW: 12.5 % (ref 11.2–14.5)

## 2011-05-07 LAB — CMP (CANCER CENTER ONLY)
AST: 20 U/L (ref 11–38)
Albumin: 3.6 g/dL (ref 3.3–5.5)
Alkaline Phosphatase: 63 U/L (ref 26–84)
BUN, Bld: 11 mg/dL (ref 7–22)
Potassium: 4.6 mEq/L (ref 3.3–4.7)
Sodium: 143 mEq/L (ref 128–145)
Total Bilirubin: 0.8 mg/dl (ref 0.20–1.60)
Total Protein: 6.6 g/dL (ref 6.4–8.1)

## 2011-05-07 MED ORDER — IOHEXOL 300 MG/ML  SOLN
80.0000 mL | Freq: Once | INTRAMUSCULAR | Status: AC | PRN
  Administered 2011-05-07: 80 mL via INTRAVENOUS

## 2011-05-08 ENCOUNTER — Other Ambulatory Visit: Payer: Self-pay | Admitting: *Deleted

## 2011-05-08 ENCOUNTER — Telehealth: Payer: Self-pay | Admitting: Internal Medicine

## 2011-05-08 NOTE — Telephone Encounter (Signed)
called pt to advise of cx appt for 3/20 and r/s to 3/26    aom

## 2011-05-09 ENCOUNTER — Ambulatory Visit: Payer: MEDICARE | Admitting: Internal Medicine

## 2011-05-09 ENCOUNTER — Telehealth: Payer: Self-pay | Admitting: Internal Medicine

## 2011-05-09 NOTE — Telephone Encounter (Signed)
pt could not wait for ct results and per sj we can see her 3/21 at 12:00,pt aware  aom

## 2011-05-10 ENCOUNTER — Telehealth: Payer: Self-pay | Admitting: Internal Medicine

## 2011-05-10 ENCOUNTER — Ambulatory Visit (HOSPITAL_BASED_OUTPATIENT_CLINIC_OR_DEPARTMENT_OTHER): Payer: MEDICARE | Admitting: Internal Medicine

## 2011-05-10 DIAGNOSIS — C3430 Malignant neoplasm of lower lobe, unspecified bronchus or lung: Secondary | ICD-10-CM

## 2011-05-10 DIAGNOSIS — C3490 Malignant neoplasm of unspecified part of unspecified bronchus or lung: Secondary | ICD-10-CM

## 2011-05-10 NOTE — Telephone Encounter (Signed)
Gv pt appt for [id number].  scheduled pt for ct scan on 05/06/2012 @ [facility]

## 2011-05-11 NOTE — Progress Notes (Signed)
[facility] [phone]   Fax:([phone]  OFFICE PROGRESS NOTE  Tillman Abide, MD, MD [address] [place] [address]  PRINCIPAL DIAGNOSIS:  Stage IA (T1N0M0) non-small cell lung cancer, adenocarcinoma, diagnosed in February 2009.  PRIOR THERAPY:  Status post right lower lobectomy with lymph node dissection under the care of Dr Edwyna Shell on April 17, 2007.  CURRENT THERAPY:  Observation.  INTERVAL HISTORY: [name] 66 y.o. female returns to the clinic today for annual followup visit accompanied by her husband. The patient has no complaints today. She denied having any significant chest pain or shortness breath, no cough or hemoptysis. She has no weight loss or night sweats. The patient and her husband are in the process of moving to [place]. She had repeat CT scan of the chest performed recently and she is here today for evaluation and discussion of her scan results.  MEDICAL HISTORY: Past Medical History  Diagnosis Date  . GERD (gastroesophageal reflux disease)   . Diverticulitis   . Internal hemorrhoid   . Hyperplastic colon polyp   . Lung cancer dx'd 03/26/07    mixed acinar and non-mucinous bronchoalveolar, Stage 1A    ALLERGIES:   has no known allergies.  MEDICATIONS:  Current Outpatient Prescriptions  Medication Sig Dispense Refill  . calcium-vitamin D (OSCAL WITH D) 500-200 MG-UNIT per tablet Take 1 tablet by mouth daily.        . fish oil-omega-3 fatty acids 1000 MG capsule Take 2 g by mouth daily.        . Multiple Vitamins-Minerals (CENTRUM SILVER PO) Take by mouth daily.        Marland Kitchen esomeprazole (NEXIUM) 40 MG capsule Take 40 mg by mouth daily before breakfast.        . zolpidem (AMBIEN) 10 MG tablet Take 1 tablet (10 mg total) by mouth at bedtime as needed.  30 tablet  0    SURGICAL HISTORY:  Past Surgical History  Procedure Date  . Knee arthroscopy     left  . Tonsillectomy   . Tubal ligation   .  Lobectomy     left lower    REVIEW OF SYSTEMS:  A comprehensive review of systems was negative.   PHYSICAL EXAMINATION: General appearance: alert, cooperative and no distress Neck: no adenopathy Lymph nodes: Cervical, supraclavicular, and axillary nodes normal. Resp: clear to auscultation bilaterally Cardio: regular rate and rhythm, S1, S2 normal, no murmur, click, rub or gallop GI: soft, non-tender; bowel sounds normal; no masses,  no organomegaly Extremities: extremities normal, atraumatic, no cyanosis or edema Neurologic: Alert and oriented X 3, normal strength and tone. Normal symmetric reflexes. Normal coordination and gait  ECOG PERFORMANCE STATUS: 0 - Asymptomatic  There were no vitals taken for this visit.  LABORATORY DATA: Lab Results  Component Value Date   WBC 4.7 05/07/2011   HGB 14.8 05/07/2011   HCT 42.8 05/07/2011   MCV 84.0 05/07/2011   PLT 258 05/07/2011      Chemistry      Component Value Date/Time   NA 143 05/07/2011 0810   NA 141 07/05/2010 1126   K 4.6 05/07/2011 0810   K 5.1 07/05/2010 1126   CL 99 05/07/2011 0810   CL 106 07/05/2010 1126   CO2 29 05/07/2011 0810   CO2 30 07/05/2010 1126   BUN 11 05/07/2011 0810   BUN 13 07/05/2010 1126   CREATININE 0.7 05/07/2011 0810   CREATININE 0.8  07/05/2010 1126      Component Value Date/Time   CALCIUM 8.6 05/07/2011 0810   CALCIUM 9.2 07/05/2010 1126   ALKPHOS 63 05/07/2011 0810   ALKPHOS 59 07/05/2010 1126   AST 20 05/07/2011 0810   AST 18 07/05/2010 1126   ALT 18 07/05/2010 1126   BILITOT 0.80 05/07/2011 0810   BILITOT 0.7 07/05/2010 1126       RADIOGRAPHIC STUDIES: Ct Chest W Contrast  05/07/2011  *RADIOLOGY REPORT*  Clinical Data: Lung cancer, cough.  CT CHEST WITH CONTRAST  Technique:  Multidetector CT imaging of the chest was performed following the standard protocol during bolus administration of intravenous contrast.  Contrast: 80mL OMNIPAQUE IOHEXOL 300 MG/ML IJ SOLN  Comparison: 05/08/2010.  Findings: No  pathologically enlarged mediastinal, hilar or axillary lymph nodes.  Heart size normal.  No pericardial effusion.  Mild biapical pleural parenchymal scarring.  Postoperative changes and volume loss are seen in the right hemithorax.  Left lung is clear.  No pleural fluid.  Airway is otherwise unremarkable.  Incidental imaging of the upper abdomen shows a 9 mm hyperattenuating lesion in the dome of the liver, present on multiple prior exams.  No worrisome lytic or sclerotic lesions.  IMPRESSION: Postoperative changes in the right hemithorax without evidence of recurrent or metastatic disease.  Original Report Authenticated By: Reyes Ivan, M.D.    ASSESSMENT: This is a very pleasant 66 years old white female with history of stage IA non-small cell lung cancer status post right lower lobectomy with lymph node dissection and she has been observation since April 2009. The patient is doing fine and she has no evidence for disease recurrence.  PLAN: I discussed the scan results with the patient her husband and they recommended for her continence observation for now with repeat CT scan of the chest in one year. The patient is in the process of moving to [facility] and I recommended for her to have a medical oncologist there. She preferred to have her scan done here and she would come for a visit and evaluation in one year. I would see the patient back for followup visit in one year with repeat CT scan of the chest.  All questions were answered. The patient knows to call the clinic with any problems, questions or concerns. We can certainly see the patient much sooner if necessary.

## 2011-05-15 ENCOUNTER — Ambulatory Visit: Payer: MEDICARE | Admitting: Internal Medicine

## 2012-03-06 ENCOUNTER — Telehealth: Payer: Self-pay | Admitting: Medical Oncology

## 2012-03-06 NOTE — Telephone Encounter (Signed)
Pt wanted to verify appts - I left her a message with the appt dates and to call me back to confirm.

## 2012-03-06 NOTE — Telephone Encounter (Signed)
Pt called back and confirmed appts

## 2012-05-06 ENCOUNTER — Ambulatory Visit (HOSPITAL_COMMUNITY)
Admission: RE | Admit: 2012-05-06 | Discharge: 2012-05-06 | Disposition: A | Discharge status: Home / Self Care | Payer: MEDICARE | Source: Ambulatory Visit | Attending: Internal Medicine | Admitting: Internal Medicine

## 2012-05-06 ENCOUNTER — Other Ambulatory Visit (HOSPITAL_BASED_OUTPATIENT_CLINIC_OR_DEPARTMENT_OTHER): Payer: MEDICARE | Admitting: Lab

## 2012-05-06 DIAGNOSIS — C3490 Malignant neoplasm of unspecified part of unspecified bronchus or lung: Secondary | ICD-10-CM | POA: Insufficient documentation

## 2012-05-06 DIAGNOSIS — I700 Atherosclerosis of aorta: Secondary | ICD-10-CM | POA: Insufficient documentation

## 2012-05-06 DIAGNOSIS — C3430 Malignant neoplasm of lower lobe, unspecified bronchus or lung: Secondary | ICD-10-CM

## 2012-05-06 DIAGNOSIS — Z902 Acquired absence of lung [part of]: Secondary | ICD-10-CM | POA: Insufficient documentation

## 2012-05-06 LAB — COMPREHENSIVE METABOLIC PANEL (CC13)
AST: 19 U/L (ref 5–34)
Alkaline Phosphatase: 72 U/L (ref 40–150)
BUN: 12.7 mg/dL (ref 7.0–26.0)
Calcium: 9.1 mg/dL (ref 8.4–10.4)
Chloride: 106 mEq/L (ref 98–107)
Creatinine: 0.9 mg/dL (ref 0.6–1.1)
Glucose: 99 mg/dl (ref 70–99)

## 2012-05-06 LAB — CBC WITH DIFFERENTIAL/PLATELET
Basophils Absolute: 0 10*3/uL (ref 0.0–0.1)
EOS%: 4 % (ref 0.0–7.0)
Eosinophils Absolute: 0.2 10*3/uL (ref 0.0–0.5)
HCT: 46.4 % (ref 34.8–46.6)
HGB: 16 g/dL — ABNORMAL HIGH (ref 11.6–15.9)
MCH: 29.2 pg (ref 25.1–34.0)
NEUT#: 3.5 10*3/uL (ref 1.5–6.5)
NEUT%: 57.4 % (ref 38.4–76.8)
lymph#: 1.9 10*3/uL (ref 0.9–3.3)

## 2012-05-06 MED ORDER — IOHEXOL 300 MG/ML  SOLN
80.0000 mL | Freq: Once | INTRAMUSCULAR | Status: AC | PRN
  Administered 2012-05-06: 80 mL via INTRAVENOUS

## 2012-05-08 ENCOUNTER — Telehealth: Payer: Self-pay | Admitting: Internal Medicine

## 2012-05-08 ENCOUNTER — Ambulatory Visit (HOSPITAL_BASED_OUTPATIENT_CLINIC_OR_DEPARTMENT_OTHER): Payer: MEDICARE | Admitting: Internal Medicine

## 2012-05-08 ENCOUNTER — Encounter: Payer: Self-pay | Admitting: Internal Medicine

## 2012-05-08 VITALS — BP 132/75 | HR 84 | Temp 97.8°F | Resp 18 | Ht 65.0 in | Wt 153.2 lb

## 2012-05-08 DIAGNOSIS — C3490 Malignant neoplasm of unspecified part of unspecified bronchus or lung: Secondary | ICD-10-CM

## 2012-05-08 DIAGNOSIS — Z85118 Personal history of other malignant neoplasm of bronchus and lung: Secondary | ICD-10-CM

## 2012-05-08 MED ORDER — CYCLOBENZAPRINE HCL 10 MG PO TABS: 10.0000 mg | ORAL_TABLET | Freq: Three times a day (TID) | ORAL | Status: DC | PRN

## 2012-05-08 NOTE — Patient Instructions (Signed)
The scan showed no evidence for disease recurrence. Followup in one year with repeat CT scan of the chest.

## 2012-05-08 NOTE — Progress Notes (Signed)
[facility] Telephone:([phone]   Fax:(336) [phone]  OFFICE PROGRESS NOTE  Tillman Abide, MD [address] [place] [address]  PRINCIPAL DIAGNOSIS: Stage IA (T1N0M0) non-small cell lung cancer, adenocarcinoma, diagnosed in February 2009.   PRIOR THERAPY: Status post right lower lobectomy with lymph node dissection under the care of Dr Edwyna Shell on April 17, 2007.   CURRENT THERAPY: Observation.  INTERVAL HISTORY: [name] 67 y.o. female returns to the clinic today for followup visit accompanied her husband. The patient is feeling fine today with no specific complaints. She denied having any significant chest pain, shortness of breath, cough or hemoptysis. She denied having any weight loss or night sweats. She currently lives in [place] and she comes on an annual basis for her lab and CT scan as well as a followup visit. She had repeat CT scan of the chest performed recently and she is here for evaluation and discussion of her scan results. She has some muscle spasm in the shoulder because of anxiety about her recent scan.  MEDICAL HISTORY: Past Medical History  Diagnosis Date  . GERD (gastroesophageal reflux disease)   . Diverticulitis   . Internal hemorrhoid   . Hyperplastic colon polyp   . Lung cancer dx'd 03/26/07    mixed acinar and non-mucinous bronchoalveolar, Stage 1A    ALLERGIES:  has No Known Allergies.  MEDICATIONS:  Current Outpatient Prescriptions  Medication Sig Dispense Refill  . calcium-vitamin D (OSCAL WITH D) 500-200 MG-UNIT per tablet Take 1 tablet by mouth daily.        . fish oil-omega-3 fatty acids 1000 MG capsule Take 2 g by mouth daily.        . Multiple Vitamins-Minerals (CENTRUM SILVER PO) Take by mouth daily.         No current facility-administered medications for this visit.    SURGICAL HISTORY:  Past Surgical History  Procedure Laterality Date  . Knee arthroscopy      left  . Tonsillectomy    .  Tubal ligation    . Lobectomy      left lower    REVIEW OF SYSTEMS:  A comprehensive review of systems was negative except for: Musculoskeletal: positive for neck pain   PHYSICAL EXAMINATION: General appearance: alert, cooperative and no distress Head: Normocephalic, without obvious abnormality, atraumatic Neck: no adenopathy Resp: clear to auscultation bilaterally Cardio: regular rate and rhythm, S1, S2 normal, no murmur, click, rub or gallop GI: soft, non-tender; bowel sounds normal; no masses,  no organomegaly Extremities: extremities normal, atraumatic, no cyanosis or edema  ECOG PERFORMANCE STATUS: 0 - Asymptomatic  Blood pressure 132/75, pulse 84, temperature 97.8 F (36.6 C), temperature source Oral, resp. rate 18, height 5\' 5"  (1.651 m), weight 153 lb 3.2 oz (69.491 kg).  LABORATORY DATA: Lab Results  Component Value Date   WBC 6.0 05/06/2012   HGB 16.0* 05/06/2012   HCT 46.4 05/06/2012   MCV 84.8 05/06/2012   PLT 256 05/06/2012      Chemistry      Component Value Date/Time   NA 141 05/06/2012 0757   NA 143 05/07/2011 0810   NA 141 07/05/2010 1126   K 4.1 05/06/2012 0757   K 4.6 05/07/2011 0810   K 5.1 07/05/2010 1126   CL 106 05/06/2012 0757   CL 99 05/07/2011 0810   CL 106 07/05/2010 1126   CO2 26 05/06/2012 0757   CO2 29 05/07/2011 0810   CO2  30 07/05/2010 1126   BUN 12.7 05/06/2012 0757   BUN 11 05/07/2011 0810   BUN 13 07/05/2010 1126   CREATININE 0.9 05/06/2012 0757   CREATININE 0.7 05/07/2011 0810   CREATININE 0.8 07/05/2010 1126      Component Value Date/Time   CALCIUM 9.1 05/06/2012 0757   CALCIUM 8.6 05/07/2011 0810   CALCIUM 9.2 07/05/2010 1126   ALKPHOS 72 05/06/2012 0757   ALKPHOS 63 05/07/2011 0810   ALKPHOS 59 07/05/2010 1126   AST 19 05/06/2012 0757   AST 20 05/07/2011 0810   AST 18 07/05/2010 1126   ALT 31 05/06/2012 0757   ALT 18 07/05/2010 1126   BILITOT 0.43 05/06/2012 0757   BILITOT 0.80 05/07/2011 0810   BILITOT 0.7 07/05/2010 1126       RADIOGRAPHIC  STUDIES: Ct Chest W Contrast  05/06/2012  *RADIOLOGY REPORT*  Clinical Data: History of lung cancer.  Staging scan.  CT CHEST WITH CONTRAST  Technique:  Multidetector CT imaging of the chest was performed following the standard protocol during bolus administration of intravenous contrast.  Contrast: 80mL OMNIPAQUE IOHEXOL 300 MG/ML  SOLN  Comparison: Chest CT 05/07/2011.  Findings:  Mediastinum: Heart size is normal. There is no significant pericardial fluid, thickening or pericardial calcification. No pathologically enlarged mediastinal or hilar lymph nodes. Esophagus is unremarkable in appearance.  Mild atherosclerosis of the thoracic aorta.  Lungs/Pleura: Postoperative changes of right lower lobectomy are again noted.  There are some linear opacities in the right lung compatible with mild postoperative scarring.  No definite suspicious appearing pulmonary nodules or masses are identified to suggest local recurrence of disease or metastatic disease to the lungs at this time.  A small amount of chronic right-sided pleural thickening is noted posteriorly.  No acute consolidative airspace disease.  No pleural effusions.  Upper Abdomen: Unremarkable.  Musculoskeletal: There are no aggressive appearing lytic or blastic lesions noted in the visualized portions of the skeleton.  IMPRESSION: 1.  Status post right lower lobectomy without evidence to suggest local recurrence of disease or new metastatic disease in the thorax. 2.  Mild atherosclerosis.   Original Report Authenticated By: Trudie Reed, M.D.     ASSESSMENT: This is a very pleasant 67 years old white female with history of stage IA non-small cell lung cancer status post right lower lobectomy and lymph node dissection and has been observation since February of 2009 with no evidence for disease recurrence.   PLAN: I discussed the scan results with the patient and her husband. I recommended for her to continue on observation. I gave the patient the  option of followup with her primary care physician in [place] versus coming on annual basis for her scan and lab results. The patient would like to continue coming to [place] on annual basis for her followup and scans. I would see her back for followup visit in one year with repeat CT scan of the chest. For the muscle spasm I started the patient on Flexeril 10 mg by mouth 3 times a day when necessary. She was advised to call immediately she has any concerning symptoms. All questions were answered. The patient knows to call the clinic with any problems, questions or concerns. We can certainly see the patient much sooner if necessary.

## 2012-10-04 ENCOUNTER — Encounter: Payer: Self-pay | Admitting: Gastroenterology

## 2012-12-19 ENCOUNTER — Telehealth: Payer: Self-pay | Admitting: Internal Medicine

## 2012-12-19 ENCOUNTER — Telehealth: Payer: Self-pay | Admitting: Medical Oncology

## 2012-12-19 NOTE — Telephone Encounter (Signed)
She has a CT scan ordered  without contrast for March . Her Ct's have always been with  contrast . She wants to know why it has changed. I told her I will f/u with Arbutus Ped and Judeth Cornfield on Monday. [name] lives in [place] now and wants to make sure the correct scan is ordered. Note to [facility] and [facility]

## 2012-12-19 NOTE — Telephone Encounter (Signed)
pt called to r/s lab and est...done....gv pt CS # to r/s ct

## 2012-12-29 ENCOUNTER — Telehealth: Payer: Self-pay | Admitting: *Deleted

## 2012-12-29 NOTE — Telephone Encounter (Signed)
Message copied by Caren Griffins on Mon Dec 29, 2012  3:04 PM ------      Message from: Charma Igo      Created: Fri Dec 19, 2012 11:51 AM       She has a chest CT scan ordered  without contrast for March . Her Ct's have always been with  contrast . She wants to know why it has changed.      I told her I will f/u with Ar[facility] Ped and Judeth Cornfield on Monday. Samina lives in [place] now and wants to make sure the correct scan is ordered.       ------

## 2012-12-29 NOTE — Telephone Encounter (Signed)
Per Dr Donnald Garre, CT w/o contrast was ordered to protect the kidneys, but it is fine if she does want a scan with contrast.  Pt states she will go with Dr [facility] first inclination and will get the scan w/o contrast.  SLJ

## 2013-01-06 ENCOUNTER — Encounter: Payer: Self-pay | Admitting: Medical Oncology

## 2013-05-05 ENCOUNTER — Other Ambulatory Visit: Payer: MEDICARE | Admitting: Lab

## 2013-05-05 ENCOUNTER — Other Ambulatory Visit (HOSPITAL_COMMUNITY): Payer: MEDICARE

## 2013-05-07 ENCOUNTER — Ambulatory Visit: Payer: MEDICARE | Admitting: Internal Medicine

## 2013-05-11 ENCOUNTER — Other Ambulatory Visit: Payer: Self-pay | Admitting: Medical Oncology

## 2013-05-11 DIAGNOSIS — C3490 Malignant neoplasm of unspecified part of unspecified bronchus or lung: Secondary | ICD-10-CM

## 2013-05-12 ENCOUNTER — Ambulatory Visit (HOSPITAL_COMMUNITY)
Admission: RE | Admit: 2013-05-12 | Discharge: 2013-05-12 | Disposition: A | Discharge status: Home / Self Care | Payer: MEDICARE | Source: Ambulatory Visit | Attending: Internal Medicine | Admitting: Internal Medicine

## 2013-05-12 ENCOUNTER — Other Ambulatory Visit (HOSPITAL_BASED_OUTPATIENT_CLINIC_OR_DEPARTMENT_OTHER): Payer: MEDICARE

## 2013-05-12 DIAGNOSIS — C3490 Malignant neoplasm of unspecified part of unspecified bronchus or lung: Secondary | ICD-10-CM

## 2013-05-12 DIAGNOSIS — Z85118 Personal history of other malignant neoplasm of bronchus and lung: Secondary | ICD-10-CM | POA: Insufficient documentation

## 2013-05-12 DIAGNOSIS — Z902 Acquired absence of lung [part of]: Secondary | ICD-10-CM | POA: Insufficient documentation

## 2013-05-12 DIAGNOSIS — R911 Solitary pulmonary nodule: Secondary | ICD-10-CM | POA: Insufficient documentation

## 2013-05-12 LAB — COMPREHENSIVE METABOLIC PANEL (CC13)
ALK PHOS: 72 U/L (ref 40–150)
ALT: 27 U/L (ref 0–55)
AST: 17 U/L (ref 5–34)
Albumin: 3.9 g/dL (ref 3.5–5.0)
Anion Gap: 8 mEq/L (ref 3–11)
BILIRUBIN TOTAL: 0.33 mg/dL (ref 0.20–1.20)
BUN: 19.8 mg/dL (ref 7.0–26.0)
CO2: 25 mEq/L (ref 22–29)
Calcium: 9.2 mg/dL (ref 8.4–10.4)
Chloride: 108 mEq/L (ref 98–109)
Creatinine: 0.9 mg/dL (ref 0.6–1.1)
Glucose: 94 mg/dl (ref 70–140)
POTASSIUM: 4.2 meq/L (ref 3.5–5.1)
SODIUM: 141 meq/L (ref 136–145)
TOTAL PROTEIN: 6.8 g/dL (ref 6.4–8.3)

## 2013-05-12 LAB — CBC WITH DIFFERENTIAL/PLATELET
BASO%: 0.4 % (ref 0.0–2.0)
Basophils Absolute: 0 10*3/uL (ref 0.0–0.1)
EOS%: 2.5 % (ref 0.0–7.0)
Eosinophils Absolute: 0.2 10*3/uL (ref 0.0–0.5)
HCT: 46.4 % (ref 34.8–46.6)
HGB: 15.8 g/dL (ref 11.6–15.9)
LYMPH%: 32.2 % (ref 14.0–49.7)
MCH: 29.2 pg (ref 25.1–34.0)
MCHC: 34 g/dL (ref 31.5–36.0)
MCV: 86.1 fL (ref 79.5–101.0)
MONO#: 0.4 10*3/uL (ref 0.1–0.9)
MONO%: 7.2 % (ref 0.0–14.0)
NEUT%: 57.7 % (ref 38.4–76.8)
NEUTROS ABS: 3.6 10*3/uL (ref 1.5–6.5)
Platelets: 283 10*3/uL (ref 145–400)
RBC: 5.39 10*6/uL (ref 3.70–5.45)
RDW: 12.8 % (ref 11.2–14.5)
WBC: 6.2 10*3/uL (ref 3.9–10.3)
lymph#: 2 10*3/uL (ref 0.9–3.3)

## 2013-05-14 ENCOUNTER — Encounter: Payer: Self-pay | Admitting: Internal Medicine

## 2013-05-14 ENCOUNTER — Telehealth: Payer: Self-pay | Admitting: Internal Medicine

## 2013-05-14 ENCOUNTER — Ambulatory Visit (HOSPITAL_BASED_OUTPATIENT_CLINIC_OR_DEPARTMENT_OTHER): Payer: MEDICARE | Admitting: Internal Medicine

## 2013-05-14 VITALS — BP 127/56 | HR 70 | Temp 97.9°F | Resp 18 | Ht 65.0 in | Wt 153.5 lb

## 2013-05-14 DIAGNOSIS — C3490 Malignant neoplasm of unspecified part of unspecified bronchus or lung: Secondary | ICD-10-CM

## 2013-05-14 DIAGNOSIS — Z85118 Personal history of other malignant neoplasm of bronchus and lung: Secondary | ICD-10-CM

## 2013-05-14 NOTE — Telephone Encounter (Signed)
gv and printed aptp sched and avs for pt for March 2016

## 2013-05-14 NOTE — Progress Notes (Signed)
[facility] Telephone:(336) [phone]   Fax:(336) [phone]  OFFICE PROGRESS NOTE  Viviana Simpler, MD [place] [address]  PRINCIPAL DIAGNOSIS: Stage IA (T1N0M0) non-small cell lung cancer, adenocarcinoma, diagnosed in February 2009.   PRIOR THERAPY: Status post right lower lobectomy with lymph node dissection under the care of Dr Arlyce Dice on April 17, 2007.   CURRENT THERAPY: Observation.   INTERVAL HISTORY: [name] 68 y.o. female returns to the clinic today for followup visit accompanied her husband. The patient is feeling fine today with no specific complaints. She denied having any significant chest pain, shortness of breath, cough or hemoptysis. She denied having any weight loss or night sweats. She currently lives in [place] and she comes on an annual basis for her lab and CT scan as well as a followup visit. She had repeat CT scan of the chest performed recently and she is here for evaluation and discussion of her scan results.   MEDICAL HISTORY: Past Medical History  Diagnosis Date  . GERD (gastroesophageal reflux disease)   . Diverticulitis   . Internal hemorrhoid   . Hyperplastic colon polyp   . Lung cancer dx'd 03/26/07    mixed acinar and non-mucinous bronchoalveolar, Stage 1A    ALLERGIES:  has No Known Allergies.  MEDICATIONS:  Current Outpatient Prescriptions  Medication Sig Dispense Refill  . calcium-vitamin D (OSCAL WITH D) 500-200 MG-UNIT per tablet Take 1 tablet by mouth daily.        . fish oil-omega-3 fatty acids 1000 MG capsule Take 2 g by mouth daily.        . Multiple Vitamins-Minerals (CENTRUM SILVER PO) Take by mouth daily.         No current facility-administered medications for this visit.    SURGICAL HISTORY:  Past Surgical History  Procedure Laterality Date  . Knee arthroscopy      left  . Tonsillectomy    . Tubal ligation    . Lobectomy      left lower    REVIEW OF SYSTEMS:  A  comprehensive review of systems was negative.   PHYSICAL EXAMINATION: General appearance: alert, cooperative and no distress Head: Normocephalic, without obvious abnormality, atraumatic Neck: no adenopathy Resp: clear to auscultation bilaterally Cardio: regular rate and rhythm, S1, S2 normal, no murmur, click, rub or gallop GI: soft, non-tender; bowel sounds normal; no masses,  no organomegaly Extremities: extremities normal, atraumatic, no cyanosis or edema  ECOG PERFORMANCE STATUS: 0 - Asymptomatic  Blood pressure 127/56, pulse 70, temperature 97.9 F (36.6 C), temperature source Oral, resp. rate 18, height 5\' 5"  (1.651 m), weight 153 lb 8 oz (69.627 kg), SpO2 100.00%.  LABORATORY DATA: Lab Results  Component Value Date   WBC 6.2 05/12/2013   HGB 15.8 05/12/2013   HCT 46.4 05/12/2013   MCV 86.1 05/12/2013   PLT 283 05/12/2013      Chemistry      Component Value Date/Time   NA 141 05/12/2013 0806   NA 143 05/07/2011 0810   NA 141 07/05/2010 1126   K 4.2 05/12/2013 0806   K 4.6 05/07/2011 0810   K 5.1 07/05/2010 1126   CL 106 05/06/2012 0757   CL 99 05/07/2011 0810   CL 106 07/05/2010 1126   CO2 25 05/12/2013 0806   CO2 29 05/07/2011 0810   CO2 30 07/05/2010 1126   BUN 19.8 05/12/2013 0806   BUN 11 05/07/2011 0810   BUN  13 07/05/2010 1126   CREATININE 0.9 05/12/2013 0806   CREATININE 0.7 05/07/2011 0810   CREATININE 0.8 07/05/2010 1126      Component Value Date/Time   CALCIUM 9.2 05/12/2013 0806   CALCIUM 8.6 05/07/2011 0810   CALCIUM 9.2 07/05/2010 1126   ALKPHOS 72 05/12/2013 0806   ALKPHOS 63 05/07/2011 0810   ALKPHOS 59 07/05/2010 1126   AST 17 05/12/2013 0806   AST 20 05/07/2011 0810   AST 18 07/05/2010 1126   ALT 27 05/12/2013 0806   ALT 24 05/07/2011 0810   ALT 18 07/05/2010 1126   BILITOT 0.33 05/12/2013 0806   BILITOT 0.80 05/07/2011 0810   BILITOT 0.7 07/05/2010 1126       RADIOGRAPHIC STUDIES: Ct Chest Wo Contrast  05/12/2013   CLINICAL DATA:  History of lung cancer  EXAM:  CT CHEST WITHOUT CONTRAST  TECHNIQUE: Multidetector CT imaging of the chest was performed following the standard protocol without IV contrast.  COMPARISON:  05/06/2012  FINDINGS: There is no pleural effusion. Postop change from right lower lobectomy identified. There is a tiny nodule in the right upper lung measuring 4 mm. No new or enlarging pulmonary nodule or mass.  The heart size appears normal. No pericardial effusion. There is no enlarged mediastinal or hilar lymph nodes. No axillary or supraclavicular adenopathy identified.  Incidental imaging through the upper abdomen is on unremarkable. No acute findings identified.  Review of the visualized osseous structures is on unremarkable. There are no aggressive lytic or sclerotic bone lesions.  IMPRESSION: 1. No acute cardiopulmonary abnormalities. 2. Status post right lower lobectomy without specific features to suggest recurrence of disease or metastatic disease. 3. Stable 4 mm right upper lung nodule.   Electronically Signed   By: Kerby Moors M.D.   On: 05/12/2013 09:53    ASSESSMENT AND PLAN: This is a very pleasant 68 years old white female with history of stage IA non-small cell lung cancer status post right lower lobectomy and lymph node dissection and has been observation since February of 2009 with no evidence for disease recurrence. I discussed the scan results with the patient and her husband. I recommended for her to continue on observation. The patient would like to continue coming to [place] on annual basis for her followup and scans. I would see her back for followup visit in one year with repeat CT scan of the chest. She was advised to call immediately she has any concerning symptoms. All questions were answered. The patient knows to call the clinic with any problems, questions or concerns. We can certainly see the patient much sooner if necessary.  Disclaimer: This note was dictated with voice recognition software. Similar sounding  words can inadvertently be transcribed and may not be corrected upon review.

## 2013-07-18 ENCOUNTER — Encounter: Payer: Self-pay | Admitting: Gastroenterology

## 2014-04-02 ENCOUNTER — Telehealth: Payer: Self-pay | Admitting: Internal Medicine

## 2014-04-02 NOTE — Telephone Encounter (Signed)
s.w. pt and advised on 3.24 appt moved to 3.23 due to MD on pal....pt lives in [place] and requested 3.23...done pt is aware of appointments

## 2014-05-11 ENCOUNTER — Other Ambulatory Visit (HOSPITAL_BASED_OUTPATIENT_CLINIC_OR_DEPARTMENT_OTHER): Payer: MEDICARE

## 2014-05-11 ENCOUNTER — Ambulatory Visit (HOSPITAL_COMMUNITY)
Admission: RE | Admit: 2014-05-11 | Discharge: 2014-05-11 | Disposition: A | Discharge status: Home / Self Care | Payer: Medicare Other | Source: Ambulatory Visit | Attending: Internal Medicine | Admitting: Internal Medicine

## 2014-05-11 ENCOUNTER — Encounter (HOSPITAL_COMMUNITY): Payer: Self-pay

## 2014-05-11 DIAGNOSIS — R05 Cough: Secondary | ICD-10-CM | POA: Diagnosis present

## 2014-05-11 DIAGNOSIS — Z9889 Other specified postprocedural states: Secondary | ICD-10-CM | POA: Diagnosis not present

## 2014-05-11 DIAGNOSIS — C3490 Malignant neoplasm of unspecified part of unspecified bronchus or lung: Secondary | ICD-10-CM

## 2014-05-11 DIAGNOSIS — Z85118 Personal history of other malignant neoplasm of bronchus and lung: Secondary | ICD-10-CM

## 2014-05-11 LAB — CBC WITH DIFFERENTIAL/PLATELET
BASO%: 0.2 % (ref 0.0–2.0)
BASOS ABS: 0 10*3/uL (ref 0.0–0.1)
EOS ABS: 0.2 10*3/uL (ref 0.0–0.5)
EOS%: 3.2 % (ref 0.0–7.0)
HCT: 47.4 % — ABNORMAL HIGH (ref 34.8–46.6)
HGB: 15.6 g/dL (ref 11.6–15.9)
LYMPH%: 32.3 % (ref 14.0–49.7)
MCH: 28.7 pg (ref 25.1–34.0)
MCHC: 32.9 g/dL (ref 31.5–36.0)
MCV: 87.3 fL (ref 79.5–101.0)
MONO#: 0.4 10*3/uL (ref 0.1–0.9)
MONO%: 7.5 % (ref 0.0–14.0)
NEUT%: 56.8 % (ref 38.4–76.8)
NEUTROS ABS: 2.8 10*3/uL (ref 1.5–6.5)
Platelets: 248 10*3/uL (ref 145–400)
RBC: 5.43 10*6/uL (ref 3.70–5.45)
RDW: 12.2 % (ref 11.2–14.5)
WBC: 5 10*3/uL (ref 3.9–10.3)
lymph#: 1.6 10*3/uL (ref 0.9–3.3)

## 2014-05-11 LAB — COMPREHENSIVE METABOLIC PANEL (CC13)
ALT: 21 U/L (ref 0–55)
ANION GAP: 9 meq/L (ref 3–11)
AST: 15 U/L (ref 5–34)
Albumin: 3.7 g/dL (ref 3.5–5.0)
Alkaline Phosphatase: 73 U/L (ref 40–150)
BILIRUBIN TOTAL: 0.49 mg/dL (ref 0.20–1.20)
BUN: 14.3 mg/dL (ref 7.0–26.0)
CO2: 26 meq/L (ref 22–29)
CREATININE: 0.9 mg/dL (ref 0.6–1.1)
Calcium: 9.1 mg/dL (ref 8.4–10.4)
Chloride: 109 mEq/L (ref 98–109)
EGFR: 69 mL/min/{1.73_m2} — AB (ref >90)
GLUCOSE: 97 mg/dL (ref 70–140)
Potassium: 4.8 mEq/L (ref 3.5–5.1)
SODIUM: 144 meq/L (ref 136–145)
Total Protein: 6.6 g/dL (ref 6.4–8.3)

## 2014-05-12 ENCOUNTER — Telehealth: Payer: Self-pay | Admitting: Medical Oncology

## 2014-05-12 ENCOUNTER — Encounter: Payer: Self-pay | Admitting: Internal Medicine

## 2014-05-12 ENCOUNTER — Ambulatory Visit (HOSPITAL_BASED_OUTPATIENT_CLINIC_OR_DEPARTMENT_OTHER): Payer: MEDICARE | Admitting: Internal Medicine

## 2014-05-12 VITALS — BP 126/64 | HR 68 | Temp 98.0°F | Resp 18 | Ht 65.0 in | Wt 150.6 lb

## 2014-05-12 DIAGNOSIS — Z85118 Personal history of other malignant neoplasm of bronchus and lung: Secondary | ICD-10-CM | POA: Diagnosis not present

## 2014-05-12 DIAGNOSIS — C3491 Malignant neoplasm of unspecified part of right bronchus or lung: Secondary | ICD-10-CM

## 2014-05-12 NOTE — Telephone Encounter (Signed)
Per pt request i faxed office note.

## 2014-05-12 NOTE — Progress Notes (Signed)
[place] Telephone:([phone]) [phone]   Fax:(336) [phone]  OFFICE PROGRESS NOTE  Viviana Simpler, MD [place] [address]  PRINCIPAL DIAGNOSIS: Stage IA (T1N0M0) non-small cell lung cancer, adenocarcinoma, diagnosed in February 2009.   PRIOR THERAPY: Status post right lower lobectomy with lymph node dissection under the care of Dr Arlyce Dice on April 17, 2007.   CURRENT THERAPY: Observation.   INTERVAL HISTORY: [name] 69 y.o. female returns to the clinic today for annual followup visit accompanied her husband. The patient is feeling fine today with no specific complaints. She denied having any significant chest pain, shortness of breath, cough or hemoptysis. She denied having any weight loss or night sweats. She skies and plays tennis at regular basis. She currently lives in [place] and she comes on an annual basis for her lab and CT scan as well as a followup visit. She had repeat CT scan of the chest performed recently and she is here for evaluation and discussion of her scan results.   MEDICAL HISTORY: Past Medical History  Diagnosis Date  . GERD (gastroesophageal reflux disease)   . Diverticulitis   . Internal hemorrhoid   . Hyperplastic colon polyp   . Lung cancer dx'd 03/26/07    mixed acinar and non-mucinous bronchoalveolar, Stage 1A    ALLERGIES:  has No Known Allergies.  MEDICATIONS:  Current Outpatient Prescriptions  Medication Sig Dispense Refill  . calcium-vitamin D (OSCAL WITH D) 500-200 MG-UNIT per tablet Take 1 tablet by mouth daily.      . fish oil-omega-3 fatty acids 1000 MG capsule Take 2 g by mouth daily.      . Multiple Vitamins-Minerals (CENTRUM SILVER PO) Take by mouth daily.       No current facility-administered medications for this visit.    SURGICAL HISTORY:  Past Surgical History  Procedure Laterality Date  . Knee arthroscopy      left  . Tonsillectomy    . Tubal ligation    . Lobectomy     left lower    REVIEW OF SYSTEMS:  A comprehensive review of systems was negative.   PHYSICAL EXAMINATION: General appearance: alert, cooperative and no distress Head: Normocephalic, without obvious abnormality, atraumatic Neck: no adenopathy Resp: clear to auscultation bilaterally Cardio: regular rate and rhythm, S1, S2 normal, no murmur, click, rub or gallop GI: soft, non-tender; bowel sounds normal; no masses,  no organomegaly Extremities: extremities normal, atraumatic, no cyanosis or edema  ECOG PERFORMANCE STATUS: 0 - Asymptomatic  There were no vitals taken for this visit.  LABORATORY DATA: Lab Results  Component Value Date   WBC 5.0 05/11/2014   HGB 15.6 05/11/2014   HCT 47.4* 05/11/2014   MCV 87.3 05/11/2014   PLT 248 05/11/2014      Chemistry      Component Value Date/Time   NA 144 05/11/2014 0810   NA 143 05/07/2011 0810   NA 141 07/05/2010 1126   K 4.8 05/11/2014 0810   K 4.6 05/07/2011 0810   K 5.1 07/05/2010 1126   CL 106 05/06/2012 0757   CL 99 05/07/2011 0810   CL 106 07/05/2010 1126   CO2 26 05/11/2014 0810   CO2 29 05/07/2011 0810   CO2 30 07/05/2010 1126   BUN 14.3 05/11/2014 0810   BUN 11 05/07/2011 0810   BUN 13 07/05/2010 1126   CREATININE 0.9 05/11/2014 0810   CREATININE 0.7 05/07/2011 0810   CREATININE 0.8 07/05/2010 1126  Component Value Date/Time   CALCIUM 9.1 05/11/2014 0810   CALCIUM 8.6 05/07/2011 0810   CALCIUM 9.2 07/05/2010 1126   ALKPHOS 73 05/11/2014 0810   ALKPHOS 63 05/07/2011 0810   ALKPHOS 59 07/05/2010 1126   AST 15 05/11/2014 0810   AST 20 05/07/2011 0810   AST 18 07/05/2010 1126   ALT 21 05/11/2014 0810   ALT 24 05/07/2011 0810   ALT 18 07/05/2010 1126   BILITOT 0.49 05/11/2014 0810   BILITOT 0.80 05/07/2011 0810   BILITOT 0.7 07/05/2010 1126       RADIOGRAPHIC STUDIES: Ct Chest Wo Contrast  05/11/2014   CLINICAL DATA:  Lung cancer diagnosed 03/26/2007. Post right lower lobectomy. Staging. Cough.  EXAM:  CT CHEST WITHOUT CONTRAST  TECHNIQUE: Multidetector CT imaging of the chest was performed following the standard protocol without IV contrast.  COMPARISON:  05/12/2013  FINDINGS: CT CHEST FINDINGS  Mediastinum/Nodes: Heart is normal size. Aorta is normal caliber. No mediastinal, hilar, or axillary adenopathy.  Lungs/Pleura: Postoperative changes in the right lung. Areas of scarring medially, stable. No recurrent mass. Stable 4 mm right upper lobe nodule. No suspicious pulmonary nodules or pleural effusion.  Musculoskeletal: Chest wall soft tissues are unremarkable. No acute bony abnormality or focal bone lesion.  Upper abdomen: Imaging into the upper abdomen shows no acute findings.  IMPRESSION: Stable postoperative changes from right lower lobectomy. No evidence of recurrent or metastatic disease in the chest.  Stable 4 mm right upper lobe nodule.   Electronically Signed   By: Rolm Baptise M.D.   On: 05/11/2014 09:44    ASSESSMENT AND PLAN: This is a very pleasant 69 years old white female with history of stage IA non-small cell lung cancer status post right lower lobectomy and lymph node dissection and has been observation since February of 2009 with no evidence for disease recurrence except for a stable 4 mm right upper lobe nodule. I discussed the scan results with the patient and her husband.  The patient has been observation for the last 8 years with no evidence for recurrence. I recommended for her to continue on observation with her primary care physician with annual chest x-ray or CT scan if she has any concerning symptoms. She was advised to call immediately she has any concerning symptoms. All questions were answered. The patient knows to call the clinic with any problems, questions or concerns. We can certainly see the patient much sooner if necessary.  Disclaimer: This note was dictated with voice recognition software. Similar sounding words can inadvertently be transcribed and may not be  corrected upon review.

## 2014-05-13 ENCOUNTER — Ambulatory Visit: Payer: MEDICARE | Admitting: Internal Medicine

## 2015-05-06 ENCOUNTER — Telehealth: Payer: Self-pay | Admitting: *Deleted

## 2015-05-06 NOTE — Telephone Encounter (Signed)
"  I need a CD of my CT chest for provider in [place] to make comparison to my recent scans.  Current provider who needs this is Dr. Chesley Mires, [address], [place], [place], [place].  [phone]."  Called radiology with this request. Return call from Palmer who can make CD.  Provider address provided to [facility]. Called Ms. Sova notifying her this will be sent out by Monday of next week.
